# Patient Record
Sex: Female | Born: 1975
Health system: Southern US, Community
[De-identification: ages and names within clinical notes are randomized; demographics above are authoritative.]

## PROBLEM LIST (undated history)

## (undated) DIAGNOSIS — R002 Palpitations: Secondary | ICD-10-CM

## (undated) DIAGNOSIS — J45909 Unspecified asthma, uncomplicated: Secondary | ICD-10-CM

## (undated) DIAGNOSIS — N920 Excessive and frequent menstruation with regular cycle: Secondary | ICD-10-CM

## (undated) DIAGNOSIS — D649 Anemia, unspecified: Secondary | ICD-10-CM

## (undated) DIAGNOSIS — E010 Iodine-deficiency related diffuse (endemic) goiter: Secondary | ICD-10-CM

## (undated) DIAGNOSIS — N915 Oligomenorrhea, unspecified: Secondary | ICD-10-CM

## (undated) DIAGNOSIS — R519 Headache, unspecified: Secondary | ICD-10-CM

## (undated) HISTORY — DX: Anemia, unspecified: D64.9

## (undated) HISTORY — DX: Excessive and frequent menstruation with regular cycle: N92.0

## (undated) HISTORY — DX: Iodine-deficiency related diffuse (endemic) goiter: E01.0

## (undated) HISTORY — DX: Unspecified asthma, uncomplicated: J45.909

## (undated) HISTORY — DX: Palpitations: R00.2

## (undated) HISTORY — PX: BILATERAL CARPAL TUNNEL RELEASE: SHX6508

## (undated) HISTORY — DX: Oligomenorrhea, unspecified: N91.5

---

## 1999-06-25 ENCOUNTER — Other Ambulatory Visit: Admission: RE | Admit: 1999-06-25 | Discharge: 1999-06-25 | Payer: Self-pay

## 1999-08-30 ENCOUNTER — Encounter: Payer: Self-pay | Admitting: Emergency Medicine

## 1999-08-30 ENCOUNTER — Encounter (INDEPENDENT_AMBULATORY_CARE_PROVIDER_SITE_OTHER): Payer: Self-pay | Admitting: Specialist

## 1999-08-30 ENCOUNTER — Inpatient Hospital Stay (HOSPITAL_COMMUNITY): Admission: AD | Admit: 1999-08-30 | Discharge: 1999-09-02 | Payer: Self-pay | Admitting: *Deleted

## 1999-09-14 ENCOUNTER — Other Ambulatory Visit: Admission: RE | Admit: 1999-09-14 | Discharge: 1999-09-14 | Payer: Self-pay

## 1999-09-14 ENCOUNTER — Other Ambulatory Visit: Admission: RE | Admit: 1999-09-14 | Discharge: 1999-09-14 | Payer: Self-pay | Admitting: Obstetrics & Gynecology

## 2000-11-24 ENCOUNTER — Inpatient Hospital Stay (HOSPITAL_COMMUNITY): Admission: AD | Admit: 2000-11-24 | Discharge: 2000-11-24 | Payer: Self-pay | Admitting: Family Medicine

## 2002-09-06 ENCOUNTER — Other Ambulatory Visit: Admission: RE | Admit: 2002-09-06 | Discharge: 2002-09-06 | Payer: Self-pay | Admitting: Obstetrics & Gynecology

## 2002-10-13 ENCOUNTER — Inpatient Hospital Stay (HOSPITAL_COMMUNITY): Admission: AD | Admit: 2002-10-13 | Discharge: 2002-10-13 | Payer: Self-pay | Admitting: *Deleted

## 2002-10-13 ENCOUNTER — Encounter: Payer: Self-pay | Admitting: Obstetrics and Gynecology

## 2002-12-07 ENCOUNTER — Ambulatory Visit (HOSPITAL_COMMUNITY): Admission: RE | Admit: 2002-12-07 | Discharge: 2002-12-07 | Payer: Self-pay | Admitting: Obstetrics and Gynecology

## 2003-01-06 ENCOUNTER — Encounter (HOSPITAL_COMMUNITY): Admission: RE | Admit: 2003-01-06 | Discharge: 2003-01-27 | Payer: Self-pay | Admitting: Obstetrics and Gynecology

## 2003-02-03 ENCOUNTER — Encounter (HOSPITAL_COMMUNITY): Admission: RE | Admit: 2003-02-03 | Discharge: 2003-03-05 | Payer: Self-pay | Admitting: Obstetrics and Gynecology

## 2003-02-22 ENCOUNTER — Ambulatory Visit (HOSPITAL_COMMUNITY): Admission: RE | Admit: 2003-02-22 | Discharge: 2003-02-22 | Payer: Self-pay | Admitting: Obstetrics and Gynecology

## 2003-02-22 ENCOUNTER — Encounter: Payer: Self-pay | Admitting: Obstetrics and Gynecology

## 2003-02-24 ENCOUNTER — Inpatient Hospital Stay (HOSPITAL_COMMUNITY): Admission: AD | Admit: 2003-02-24 | Discharge: 2003-02-24 | Payer: Self-pay | Admitting: Obstetrics and Gynecology

## 2003-03-10 ENCOUNTER — Encounter (HOSPITAL_COMMUNITY): Admission: RE | Admit: 2003-03-10 | Discharge: 2003-04-09 | Payer: Self-pay | Admitting: Obstetrics and Gynecology

## 2003-04-14 ENCOUNTER — Encounter (HOSPITAL_COMMUNITY): Admission: RE | Admit: 2003-04-14 | Discharge: 2003-05-14 | Payer: Self-pay | Admitting: Obstetrics and Gynecology

## 2003-04-25 ENCOUNTER — Inpatient Hospital Stay (HOSPITAL_COMMUNITY): Admission: AD | Admit: 2003-04-25 | Discharge: 2003-04-25 | Payer: Self-pay | Admitting: Obstetrics and Gynecology

## 2003-05-11 ENCOUNTER — Inpatient Hospital Stay (HOSPITAL_COMMUNITY): Admission: AD | Admit: 2003-05-11 | Discharge: 2003-05-11 | Payer: Self-pay | Admitting: Obstetrics and Gynecology

## 2003-05-12 ENCOUNTER — Inpatient Hospital Stay (HOSPITAL_COMMUNITY): Admission: AD | Admit: 2003-05-12 | Discharge: 2003-05-12 | Payer: Self-pay | Admitting: Obstetrics and Gynecology

## 2003-05-20 ENCOUNTER — Inpatient Hospital Stay (HOSPITAL_COMMUNITY): Admission: AD | Admit: 2003-05-20 | Discharge: 2003-05-23 | Payer: Self-pay | Admitting: Obstetrics and Gynecology

## 2004-03-21 ENCOUNTER — Other Ambulatory Visit: Admission: RE | Admit: 2004-03-21 | Discharge: 2004-03-21 | Payer: Self-pay | Admitting: Obstetrics and Gynecology

## 2005-03-27 ENCOUNTER — Other Ambulatory Visit: Admission: RE | Admit: 2005-03-27 | Discharge: 2005-03-27 | Payer: Self-pay | Admitting: Obstetrics and Gynecology

## 2007-08-17 ENCOUNTER — Emergency Department (HOSPITAL_COMMUNITY): Admission: EM | Admit: 2007-08-17 | Discharge: 2007-08-17 | Payer: Self-pay | Admitting: Emergency Medicine

## 2010-09-21 NOTE — Discharge Summary (Signed)
[facility]  Patient:    Patricia Bishop, Patricia Bishop                        MRN: [id number] Adm. Date:  [id number] Disc. Date: [id number] Attending:  Ardeen Fillers CC:         Sung Amabile. Roslyn Smiling, M.D. at office                           Discharge Summary  DISCHARGE DIAGNOSES:          1. Inevitable abortion at [redacted] weeks gestational                                  age.                               2. Chorioamnionitis.                               3. History of chlamydia.  OPERATIVE PROCEDURES:         Vaginal delivery on August 31, 1999.  HISTORY OF PRESENT ILLNESS:   A 35 year old woman, G2 P0-0-1-0, last menstrual period April 24, 1999 admitted at 18+ weeks gestational age by dates after being transferred from [facility] emergency room, where she presented on the evening of August 30, 1999 with complaints of cramping and bleeding. Patient was completely dilated when she was examined there.                                She had no history of cervical surgery.  Her antenatal course was remarkable for chlamydia which was treated earlier in the pregnancy.                                On evaluation, she was febrile to 100.2 and she stated that she had been cramping for several days prior to her admission. Her abdomen was soft.  The fundus was slightly tender and there was blood in the vaginal vault.  Cervix was completely dilated and membranes were bulging. Her white blood count was 8.2.  Urinalysis was negative.  Impression was an inevitable abortion at [redacted] weeks gestational age, which could be consistent with either cervical incompetence and subsequent infection or, originally ascending infection with subsequent labor.  HOSPITAL COURSE:              She was admitted to the AICU at [facility] for delivery.  Unasyn was given.  Patient was also treated with Zithromax, given her recent history of chlamydia.  She was given IV sedation and  several hours later, delivered a stillborn female fetus.  The placenta was extruded spontaneously.  Pathology evaluation of the placenta showed an immature placenta with chromonemata and a three-vessel cord.  The baby was judged to be a female fetus with an estimated fetal age of 73 weeks.  No external phenotypic anomalies were noted.                                Unasyn was  continued through postpartum day #1. She was discharged to home on postpartum day #1, afebrile, and in satisfactory condition.  FOLLOW-UP:                    By Dr. Seymour Bars in two weeks.  Depo-Provera was given prior to discharge.  INSTRUCTIONS:                 She was advised to call should she experience fever, abdominal pain, or very heavy bleeding.  MEDICATIONS AT DISCHARGE:     Prenatal vitamins and nonsteroidal anti-inflammatory medication.                                Patient declined fetal autopsy. DD:  09/29/99 TD:  10/02/99 Job: [id number]

## 2010-09-21 NOTE — H&P (Signed)
NAME:  BRANDYCE, DIMARIO NO.:  [id number]   MEDICAL RECORD NO.:  [id number]                   PATIENT TYPE:  INP   LOCATION:  9172                                 [facility]ILITY:  [facility]   PHYSICIAN:  Osborn Coho, M.D.                DATE OF BIRTH:  13-Jan-1976   DATE OF ADMISSION:  05/20/2003  DATE OF DISCHARGE:                                HISTORY & PHYSICAL   HISTORY OF PRESENT ILLNESS:  Patricia Bishop is a 35 year old, single, black  female, gravida 3, para 0-0-2-0, at 37-4/7 weeks, who presents from the  office for evaluation to maternity admissions secondary to complaint of  uterine contractions every four to five minutes all day and possibly leaking  for the last couple of days.  She denies any large gushes of fluid, but  reports kind of feeling dampness in her underwear.  She also had a nonstress  test at the office today that was nonreactive.  She was having a nonstress  test because of recent onset of hypertension.  She denies any nausea,  vomiting, headaches, or visual disturbances.  Her pregnancy has been  followed at [facility] by the M.D. service and has been at risk  of:  1.  A history of 18-week loss with a question of incompetent cervix for  which she has had a cerclage with this pregnancy that was removed on May 11, 2003.  She also had first trimester spotting, a history of asthma, and  recent onset of pregnancy-induced hypertension, now on methyldopa 250 mg  p.o. t.i.d.  She also is positive for group B Streptococcus.  She would like  to plan on an epidural for labor.   OBSTETRICAL AND GYNECOLOGICAL HISTORY:  She is a gravida 3, para 0-0-2-0.  She had an elective AB in 1995 with no complications.  In April of 2001, she  had an 18-week loss associated with some cramping and bleeding and  subsequent delivery.  She reports that the infant did have fetal heart rate  prior to delivery, but when it was born was stillborn.  She was told  at that  time that she probably had an incompetent cervix and for that reason had a  cerclage placed with this pregnancy.  Other GYN history is significant for  chlamydia treated in 2001.   ALLERGIES:  She has no known drug allergies.   GENERAL MEDICAL HISTORY:  She reports having had the usual childhood  diseases.  She reports a history of asthma, but has had no problems  recently.  History of occasionally urinary tract infection.  Her only  surgery was wisdom tooth removal.   FAMILY HISTORY:  Significant for mother with hypertension on medication,  maternal grandmother with Parkinson's disease, and maternal grandfather with  colon cancer, now deceased.   GENETIC HISTORY:  Negative.   SOCIAL HISTORY:  She is single.  The  father of the baby is involved and  supportive.  His name is Metallurgist.  They deny any illicit drug use, alcohol,  or smoking with this pregnancy.  She is employed as a Associate Professor and he  is also employed at [employer].   PRENATAL LABORATORY DATA:  Her blood type is O positive.  Antibody screen is  negative.  Sickle cell trait is negative.  Syphilis is nonreactive.  Rubella  is positive.  Hepatitis B surface antigen is negative.  HIV is negative.  Cystic fibrosis is negative.  GC and chlamydia are negative.  Pap was within  normal limits in May of 2004.  Her 36-week beta Streptococcus was positive  and gonorrhea and chlamydia cultures were negative.   PHYSICAL EXAMINATION:  VITAL SIGNS:  Her blood pressure today is 142-150  systolic and 92-99 diastolic.  She is afebrile.  HEENT:  Grossly within normal limits.  HEART:  Regular rate and rhythm.  CHEST:  Clear.  BREASTS:  Soft and nontender.  ABDOMEN:  Gravid with uterine contractions every 3-1/2 to 5 minutes.  Her  fetal heart rate is reactive and reassuring currently.  PELVIC:  Her cervix is 4.5 cm, 90%, vertex -1 with intact membranes  palpable.  Negative ferning.  Negative nitrazine.  She did have a   biophysical profile that was 6/8 with 0 for breathing and an AFI of 5.2 cm.  EXTREMITIES:  Within normal limits.   ASSESSMENT:  1. Intrauterine pregnancy at term.  2. Early active labor.  3. Positive group B Streptococcus.  4. Pregnancy-induced hypertension, rule out preeclampsia.   PLAN:  1. Admit to labor and delivery per Osborn Coho, M.D.  2. Collect [facility] laboratories and a clean-catch urine with admission     laboratories.  3. Give her penicillin for group B Streptococcus prophylaxis and methyldopa     250 mg p.o. t.i.d.     Concha Pyo. Duplantis, C.N.M.              Osborn Coho, M.D.    SJD/MEDQ  D:  05/20/2003  T:  05/20/2003  Job:  [id number]

## 2010-09-21 NOTE — Op Note (Signed)
   NAME:  Patricia Bishop, Patricia Bishop NO.:  [id number]   MEDICAL RECORD NO.:  [id number]                   PATIENT TYPE:  AMB   LOCATION:  SDC                                  [facility]:  [facility]   PHYSICIAN:  Osborn Coho, M.D.                DATE OF BIRTH:  08-27-1975   DATE OF PROCEDURE:  12/07/2002  DATE OF DISCHARGE:                                 OPERATIVE REPORT   PREOPERATIVE DIAGNOSES:  1. Incompetent cervix.  2. Thirteen weeks 6 days estimated gestational age.   POSTOPERATIVE DIAGNOSES:  1. Incompetent cervix.  2. Thirteen weeks 6 days estimated gestational age.   PROCEDURE:  Cervical cerclage.   ANESTHESIA:  Spinal.   ATTENDING:  Osborn Coho, M.D.   FLUIDS REPLACED:  1000 mL.   ESTIMATED BLOOD LOSS:  Minimal (less than 10 mL).   URINE OUTPUT:  Approximately 50 mL via straight catheter prior to procedure.   COMPLICATIONS:  None.   FINDINGS:  Vaginal exam:  Cervix is closed and uterus is approximately 13-14  week size uterus.   DESCRIPTION OF PROCEDURE:  The patient was taken to the operating room after  the risks, benefits, and alternatives were discussed with the patient.  The  patient verbalized understanding and consent was signed and witnessed.  The  patient was given a spinal for anesthesia and placed in the dorsal lithotomy  position and prepped and draped in the normal sterile fashion.  A weighted  speculum was placed in the patient's vagina and an anterior vaginal wall  retractor used for retraction.  The cervical cerclage was placed using a  Mersilene suture.  The knot was left at the anterior lip of the cervix.  There was less than approximately 10 mL of bleeding from the cervix during  the procedure.  The cervix was hemostatic at the end of the procedure.  The  count was correct.  The patient tolerated the procedure well except for some  nausea, which required some Phenergan during the procedure.  The patient was  returned to  the recovery room in stable condition.                                               Osborn Coho, M.D.    AR/MEDQ  D:  12/07/2002  T:  12/07/2002  Job:  [id number]

## 2010-09-21 NOTE — H&P (Signed)
NAME:  Patricia Bishop, Patricia Bishop NO.:  [id number]   MEDICAL RECORD NO.:  [id number]                   PATIENT TYPE:  AMB   LOCATION:  SDC                                  [facility]   PHYSICIAN:  Osborn Coho, M.D.                DATE OF BIRTH:  1975-05-26   DATE OF ADMISSION:  12/07/2002  DATE OF DISCHARGE:                                HISTORY & PHYSICAL   HISTORY OF PRESENT ILLNESS:  The patient complains of a history of  incompetent cervix and presented to [facility] at 9 weeks estimated  gestational age.  The patient underwent ultrasound on November 30, 2002 that  showed a cervical length of 2.4 cm.  A cervical cerclage as well as  Delalutin was discussed with the patient and the patient opted for both the  cervical cerclage and is leaning towards getting the Delalutin starting at  16-20 weeks estimated gestational age.  The risks, benefits, and  alternatives of a cervical cerclage were discussed with the patient  including possible rupture of membranes.  The patient wanted to proceed with  a cervical cerclage.  Prenatal care [facility] since  approximately 9 weeks estimated gestational age with blood pressure of  100/60, positive bacterial vaginosis treated early in her pregnancy and  positive urinary tract infection treated early in the pregnancy.   PRENATAL LABORATORIES:  O positive, antibody negative, sickle cell trait  negative, hemoglobin 12.3, hematocrit 36.9, RPR nonreactive, rubella immune,  hepatitis B surface antigen negative, HIV negative, Pap smear normal,  gonorrhea negative, Chlamydia negative, CF screening negative.   PAST OBSTETRICAL HISTORY:  In 1995 elective termination of pregnancy x1 and  in 2001 pregnancy loss at 18 weeks (probable incompetent cervix).   PAST GYNECOLOGIC HISTORY:  History of regular menses, positive history of  Chlamydia treated with test of cure, no history of abnormal Pap smears, past  use  of Depo-Provera stopped 3-4 years ago.   PAST MEDICAL HISTORY:  Asthma, allergy induced (no hospitalizations or  intubations), history of urinary tract infection.   PAST SURGICAL HISTORY:  Wisdom teeth extracted, D&C.   MEDICINES:  Prenatal vitamin, Advair and albuterol p.r.n. (last used April  2004).   ALLERGIES:  No known drug allergies.   SOCIAL HISTORY:  Positive history of cigarette use approximately one to two  cigarettes per day stopped with positive pregnancy test, positive history of  occasional alcohol use stopped with positive pregnancy test, denies history  of illicit drug use.   FAMILY HISTORY:  Hypertension in mother, Parkinson's in maternal  grandmother, colon cancer in maternal grandfather and paternal grandfather.   PHYSICAL EXAMINATION:  VITAL SIGNS:  Temperature 98.9, pulse 82, blood  pressure 122/65, respiratory rate 16, fetal heart rate 150, O2 saturation  100%.  LUNGS:  Clear to auscultation bilaterally.  HEART:  Rate and rhythm are regular.  ABDOMEN:  Soft and  nontender.  EXTREMITIES:  Within normal limits.  PELVIC:  External genitalia within normal limits.  Vaginal exam cervix is  closed.  Positive fetal heart tones.   ASSESSMENT AND PLAN:  Ms. Rebacca Votaw is a 35 year old gravida 3 para 0 at  13 weeks and 6 days with a history of incompetent cervix scheduled for a  cervical cerclage on December 07, 2002.  The risks, benefits, and alternatives  have been discussed with the patient.  The patient verbalized understanding  and consent signed and witnessed.  The patient is also considering Delalutin  to start at approximately 16-20 weeks estimated gestational age.                                               Osborn Coho, M.D.    AR/MEDQ  D:  12/07/2002  T:  12/07/2002  Job:  [id number]

## 2011-01-29 LAB — DIFFERENTIAL
Basophils Absolute: 0
Basophils Relative: 1
Eosinophils Absolute: 0.4
Eosinophils Relative: 7 — ABNORMAL HIGH
Monocytes Absolute: 0.2
Monocytes Relative: 5
Neutro Abs: 2.6

## 2011-01-29 LAB — BASIC METABOLIC PANEL
CO2: 24
Calcium: 9.3
Chloride: 110
GFR calc Af Amer: >60
Glucose, Bld: 104 — ABNORMAL HIGH
Potassium: 3.6
Sodium: 141

## 2011-01-29 LAB — URINALYSIS, ROUTINE W REFLEX MICROSCOPIC
Bilirubin Urine: NEGATIVE
Glucose, UA: NEGATIVE
Hgb urine dipstick: NEGATIVE
Specific Gravity, Urine: 1.003 — ABNORMAL LOW
Urobilinogen, UA: 0.2

## 2011-01-29 LAB — POCT CARDIAC MARKERS
CKMB, poc: 1
Troponin i, poc: <0.05

## 2011-01-29 LAB — CBC
HCT: 40.6
Hemoglobin: 13.9
MCHC: 34.3
MCV: 83.3
RBC: 4.88
RDW: 12.7

## 2012-01-14 ENCOUNTER — Telehealth: Payer: Self-pay | Admitting: Obstetrics and Gynecology

## 2012-01-14 NOTE — Telephone Encounter (Signed)
TRIAGE/GENERAL QUES

## 2012-01-14 NOTE — Telephone Encounter (Signed)
Tc to pt per telephone call. Pt c/o," bldg only after x past 5 IC encounters". No dyspareunia. No vaginal odor recently. Appt sched 02/03/12 with EP for AEX and eval of bldg after IC. Pt voices understanding.

## 2012-02-03 ENCOUNTER — Ambulatory Visit: Payer: Self-pay | Admitting: Obstetrics and Gynecology

## 2012-02-03 ENCOUNTER — Encounter: Payer: Self-pay | Admitting: Obstetrics and Gynecology

## 2012-02-03 ENCOUNTER — Ambulatory Visit (INDEPENDENT_AMBULATORY_CARE_PROVIDER_SITE_OTHER): Payer: Self-pay | Admitting: Obstetrics and Gynecology

## 2012-02-03 VITALS — BP 120/80 | Resp 16 | Ht 64.0 in | Wt 174.0 lb

## 2012-02-03 DIAGNOSIS — N898 Other specified noninflammatory disorders of vagina: Secondary | ICD-10-CM

## 2012-02-03 DIAGNOSIS — N760 Acute vaginitis: Secondary | ICD-10-CM

## 2012-02-03 DIAGNOSIS — A499 Bacterial infection, unspecified: Secondary | ICD-10-CM

## 2012-02-03 DIAGNOSIS — N930 Postcoital and contact bleeding: Secondary | ICD-10-CM

## 2012-02-03 DIAGNOSIS — B9689 Other specified bacterial agents as the cause of diseases classified elsewhere: Secondary | ICD-10-CM

## 2012-02-03 LAB — POCT URINE PREGNANCY: Preg Test, Ur: NEGATIVE

## 2012-02-03 LAB — POCT WET PREP (WET MOUNT): pH: 5.5

## 2012-02-03 LAB — POCT URINALYSIS DIPSTICK
Glucose, UA: NEGATIVE
Leukocytes, UA: NEGATIVE
Nitrite, UA: NEGATIVE
Protein, UA: NEGATIVE
Urobilinogen, UA: NEGATIVE

## 2012-02-03 MED ORDER — METRONIDAZOLE 500 MG PO TABS: 500.0000 mg | ORAL_TABLET | Freq: Two times a day (BID) | ORAL | Status: DC

## 2012-02-03 NOTE — Progress Notes (Signed)
36 YO reports being on HCG for several months with heavy menses requiring the change of an overnight pad every hours, moderate cramping and flow for 5-7 days.  Normally she had a  5 day flow with regular pad change pads 3/ day (before HCG shots). Additionally, she began to experience post coital bleeding, period-like, that would resolve shortly after post-coital shower.  Denies any dyspareunia, vaginiits symptoms (except smells like ammonia), intermenstrual bleeding or pelvic pain.  Patient stopped HCG last month with periods starting to normalized but still has the post coital bleeding.  Has had the same partner for 15 years so declines STD testing.  Denies history of abnormal PAP smear.  O: Pelvic: EGBUS-wnl, vagina-thin grey malodorous discharge, cervix-everted without lesions or contact bleeding, uterus-normal size without tenderness, adnexae-no masses or tenderness  Wet Prep: pH-5.5,  whiff-positive, clue cells-many  UPT-negative.  A: Bacterial Vaginosis     Post-Coital Bleeding  P:  Patient wants to minimize evaluation due to cost.  Reviewed evaluation that is recommended for evaluation of      post-coital bleeding:  PAP smear (pt. reports her last was about 3 years ago because she doesn't have insurance)      Advised of [facility].  Also told patient that GC/CT testing and pelvic ultrasound for     pelvic masses is also a part of evaluating post-coital bleeding.       Metronidazole 500 mg  #14 bid x 7 days no refills;  Etoh precautions        RTO-as scheduled or prn;  advised that if symptoms continued, will need to do more evaluation as outlined above.  Jill Ruppe, PA-C

## 2012-02-03 NOTE — Patient Instructions (Addendum)
Bacterial Vaginosis Bacterial vaginosis (BV) is a vaginal infection where the normal balance of bacteria in the vagina is disrupted. The normal balance is then replaced by an overgrowth of certain bacteria. There are several different kinds of bacteria that can cause BV. BV is the most common vaginal infection in women of childbearing age. CAUSES   The cause of BV is not fully understood. BV develops when there is an increase or imbalance of harmful bacteria.   Some activities or behaviors can upset the normal balance of bacteria in the vagina and put women at increased risk including:   Having a new sex partner or multiple sex partners.   Douching.   Using an intrauterine device (IUD) for contraception.   It is not clear what role sexual activity plays in the development of BV. However, women that have never had sexual intercourse are rarely infected with BV.  Women do not get BV from toilet seats, bedding, swimming pools or from touching objects around them.  SYMPTOMS   Grey vaginal discharge.   A fish-like odor with discharge, especially after sexual intercourse.   Itching or burning of the vagina and vulva.   Burning or pain with urination.   Avoid: - excess soap on genital area (consider using plain oatmeal soap) - use of powder or sprays in genital area - douching - wearing underwear to bed (except with menses) - using more than is directed detergent when washing clothes - tight fitting garments around genital area - excess sugar intake    Some women have no signs or symptoms at all.  DIAGNOSIS  Your caregiver must examine the vagina for signs of BV. Your caregiver will perform lab tests and look at the sample of vaginal fluid through a microscope. They will look for bacteria and abnormal cells (clue cells), a pH test higher than 4.5, and a positive amine test all associated with BV.  RISKS AND COMPLICATIONS   Pelvic inflammatory disease (PID).   Infections following  gynecology surgery.   Developing HIV.   Developing herpes virus.  TREATMENT  Sometimes BV will clear up without treatment. However, all women with symptoms of BV should be treated to avoid complications, especially if gynecology surgery is planned. Female partners generally do not need to be treated. However, BV may spread between female sex partners so treatment is helpful in preventing a recurrence of BV.   BV may be treated with antibiotics. The antibiotics come in either pill or vaginal cream forms. Either can be used with nonpregnant or pregnant women, but the recommended dosages differ. These antibiotics are not harmful to the baby.   BV can recur after treatment. If this happens, a second round of antibiotics will often be prescribed.   Treatment is important for pregnant women. If not treated, BV can cause a premature delivery, especially for a pregnant woman who had a premature birth in the past. All pregnant women who have symptoms of BV should be checked and treated.   For chronic reoccurrence of BV, treatment with a type of prescribed gel vaginally twice a week is helpful.  HOME CARE INSTRUCTIONS   Finish all medication as directed by your caregiver.   Do not have sex until treatment is completed.   Tell your sexual partner that you have a vaginal infection. They should see their caregiver and be treated if they have problems, such as a mild rash or itching.   Practice safe sex. Use condoms. Only have 1 sex partner.  PREVENTION  Basic prevention steps can help reduce the risk of upsetting the natural balance of bacteria in the vagina and developing BV:  Do not have sexual intercourse (be abstinent).   Do not douche.   Use all of the medicine prescribed for treatment of BV, even if the signs and symptoms go away.   Tell your sex partner if you have BV. That way, they can be treated, if needed, to prevent reoccurrence.  SEEK MEDICAL CARE IF:   Your symptoms are not  improving after 3 days of treatment.   You have increased discharge, pain, or fever.  MAKE SURE YOU:   Understand these instructions.   Will watch your condition.   Will get help right away if you are not doing well or get worse.  FOR MORE INFORMATION  Division of STD Prevention (DSTDP), Centers for Disease Control and Prevention: [employer]s.[employer]za American Social Health Association (ASHA): www.ashastd.org  Document Released: 04/22/2005 Document Revised: 04/11/2011 Document Reviewed: 10/13/2008 [facility] Patient Information 2012 [place], [place].   [facility] [phone]   ask about Cervical Cancer Screening

## 2012-02-03 NOTE — Progress Notes (Signed)
Regular Periods: no Mammogram: no  Monthly Breast Ex.: no Exercise: yes  Tetanus < 10 years: yes Seatbelts: yes  NI. Bladder Functn.: yes Abuse at home: no  Daily BM's: yes Stressful Work: no  Healthy Diet: no Sigmoid-Colonoscopy: never  Calcium: no Medical problems this year: c/o bright red bleeding after IC on several different occasions. Recent HCG shots every wk for wt lost caused heavy menses soaking overnight pads had to change every 2 hrs. Discontinued HCG shots 2 months ago and menses are beginning to regulate. C/o vaginal discharge smells like ammonia before and after menses. Pt declined STD check due to lack of insurance & only wants pap done at the providers discretion.    LAST PAP: 08/2007 WNL  Contraception: none  Mammogram:  never  PCP: none  PMH: no changes per pt  FMH: no changes per pt   Last Bone Scan: never

## 2012-02-10 ENCOUNTER — Ambulatory Visit: Payer: Self-pay | Admitting: Obstetrics and Gynecology

## 2012-04-15 ENCOUNTER — Telehealth: Payer: Self-pay | Admitting: Obstetrics and Gynecology

## 2012-04-16 NOTE — Telephone Encounter (Signed)
Tc to pt regarding msg.  Pt states she typically uses Vagisil body wash and had ran out, used her boyfriend's Dial body wash and has been having vaginal itching and irritation for over a week, unsure if it is related.  Pt advised to come in for an eval.  Pt scheduled for an appt on Monday 1445 w/ EP for eval.

## 2012-04-20 ENCOUNTER — Ambulatory Visit: Payer: Self-pay | Admitting: Obstetrics and Gynecology

## 2012-05-20 ENCOUNTER — Other Ambulatory Visit: Payer: Self-pay | Admitting: Obstetrics and Gynecology

## 2012-05-20 NOTE — Telephone Encounter (Signed)
Spoke with pt rgd msg pt wants rx for bv advised pt need appt pt has appt 05/26/12 at 11:30 with ar pt voice understanding

## 2012-05-25 ENCOUNTER — Encounter: Payer: Self-pay | Admitting: Obstetrics and Gynecology

## 2012-05-26 ENCOUNTER — Encounter: Payer: Self-pay | Admitting: Obstetrics and Gynecology

## 2014-03-07 ENCOUNTER — Encounter: Payer: Self-pay | Admitting: Obstetrics and Gynecology

## 2014-08-01 ENCOUNTER — Ambulatory Visit (INDEPENDENT_AMBULATORY_CARE_PROVIDER_SITE_OTHER): Payer: BLUE CROSS/BLUE SHIELD | Admitting: Family Medicine

## 2014-08-01 ENCOUNTER — Encounter (HOSPITAL_BASED_OUTPATIENT_CLINIC_OR_DEPARTMENT_OTHER): Payer: Self-pay | Admitting: *Deleted

## 2014-08-01 ENCOUNTER — Encounter: Payer: Self-pay | Admitting: Family Medicine

## 2014-08-01 ENCOUNTER — Emergency Department (HOSPITAL_BASED_OUTPATIENT_CLINIC_OR_DEPARTMENT_OTHER)
Admission: EM | Admit: 2014-08-01 | Discharge: 2014-08-01 | Disposition: A | Discharge status: Home / Self Care | Payer: BLUE CROSS/BLUE SHIELD | Attending: Emergency Medicine | Admitting: Emergency Medicine

## 2014-08-01 VITALS — BP 121/85 | HR 62 | Ht 64.0 in | Wt 183.0 lb

## 2014-08-01 DIAGNOSIS — Z862 Personal history of diseases of the blood and blood-forming organs and certain disorders involving the immune mechanism: Secondary | ICD-10-CM | POA: Diagnosis not present

## 2014-08-01 DIAGNOSIS — M75101 Unspecified rotator cuff tear or rupture of right shoulder, not specified as traumatic: Secondary | ICD-10-CM | POA: Diagnosis not present

## 2014-08-01 DIAGNOSIS — Z8639 Personal history of other endocrine, nutritional and metabolic disease: Secondary | ICD-10-CM | POA: Diagnosis not present

## 2014-08-01 DIAGNOSIS — J45909 Unspecified asthma, uncomplicated: Secondary | ICD-10-CM | POA: Insufficient documentation

## 2014-08-01 DIAGNOSIS — M25511 Pain in right shoulder: Secondary | ICD-10-CM

## 2014-08-01 DIAGNOSIS — Z79899 Other long term (current) drug therapy: Secondary | ICD-10-CM | POA: Insufficient documentation

## 2014-08-01 DIAGNOSIS — Z8742 Personal history of other diseases of the female genital tract: Secondary | ICD-10-CM | POA: Diagnosis not present

## 2014-08-01 MED ORDER — HYDROCODONE-ACETAMINOPHEN 5-325 MG PO TABS: 1.0000 | ORAL_TABLET | Freq: Four times a day (QID) | ORAL | Status: DC | PRN

## 2014-08-01 MED ORDER — HYDROCODONE-ACETAMINOPHEN 5-325 MG PO TABS
2.0000 | ORAL_TABLET | Freq: Once | ORAL | Status: AC
  Administered 2014-08-01: 2 via ORAL
  Filled 2014-08-01: qty 2

## 2014-08-01 MED ORDER — METHYLPREDNISOLONE ACETATE 40 MG/ML IJ SUSP
40.0000 mg | Freq: Once | INTRAMUSCULAR | Status: AC
  Administered 2014-08-01: 40 mg via INTRA_ARTICULAR

## 2014-08-01 NOTE — ED Notes (Addendum)
Pt. States right shoulder pain for past week, but worse past 4 days.  States unable to lift right/move arm due to pain. Denies any injury. States she is a [employer]. Has taken ibuprofen and hot/cold patches with no relief. States she feels her right shoulder is a swollen. On exam no redness noted to right shoulder, but does have a small amount of swelling noted.

## 2014-08-01 NOTE — ED Notes (Signed)
Ice pack given to patient at d/c home

## 2014-08-01 NOTE — ED Provider Notes (Addendum)
CSN: [id number]     Arrival date & time 08/01/14  0603 History   First MD Initiated Contact with Patient 08/01/14 [phone]     Chief Complaint  Patient presents with  . 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 She is here with a one-week history of pain in her right shoulder. She denies injury. The pain has come on gradually and has acutely worsened over the past 2 days. Her right shoulder is now painful to the point that her range of motion is limited and she is unable to abduct her right upper extremity at the shoulder. There is some associated edema of the right upper arm. She describes the pain is sharp and radiating down to her elbow. There is no neck pain or change in pain with movement of her neck. There is no numbness or weakness in the right upper extremity distally. She has been taking ibuprofen without relief. She has also used topical pain relieving patches without relief.  Past Medical History  Diagnosis Date  . Oligomenorrhea   . Menorrhagia   . Thyromegaly   . Anemia   . Heart palpitations   . Asthma     allergy induced    History reviewed. No pertinent past surgical history. Family History  Problem Relation Age of Onset  . Hypertension Mother   . Cancer Maternal Grandmother   . Cancer Paternal Grandfather    History  Substance Use Topics  . Smoking status: Never Smoker   . Smokeless tobacco: Never Used  . Alcohol Use: No   OB History    Gravida Para Term Preterm AB TAB SAB Ectopic Multiple Living   2 1        1      Review of Systems  All other systems reviewed and are negative.   Allergies  Other  Home Medications   Prior to Admission medications   Medication Sig Start Date End Date Taking? Authorizing Provider  Albuterol Sulfate (PROAIR HFA IN) Inhale into the lungs as needed.    Historical Provider, MD  metroNIDAZOLE (FLAGYL) 500 MG  tablet Take 1 tablet (500 mg total) by mouth 2 (two) times daily with a meal. 02/03/12   Elmira Powell, PA-C   Pulse 83  Temp(Src) 99.1 F (37.3 C) (Oral)  Resp 18  Ht 5\' 4"  (1.626 m)  Wt 182 lb (82.555 kg)  BMI 31.22 kg/m2  SpO2 99%   Physical Exam  General: Well-developed, well-nourished female in no acute distress; appearance consistent with age of record HENT: normocephalic; atraumatic Eyes: pupils equal, round and reactive to light; extraocular muscles intact Neck: supple; full range of motion; nontender Heart: regular rate and rhythm Lungs: clear to auscultation bilaterally Abdomen: soft; nondistended Extremities: No deformity; mild edema of right shoulder and upper arm; tenderness of right rotator cuff with severe pain on attempted range of motion, notably abduction and internal rotation, right upper extremity distally neurovascularly intact Neurologic: Awake, alert and oriented; motor function intact in all extremities and symmetric; no facial droop Skin: Warm and dry Psychiatric: Tearful on exam   ED Course  Procedures (including critical care time)   MDM  Plain films are not indicated at this time as patient has no history of trauma. MRI may be indicated if symptoms persist despite conservative treatment. Will refer to Dr. Barbaraann Barthel of sports medicine.    Shanon Rosser, MD 08/01/14 3546  Shanon Rosser, MD 08/01/14 [phone]

## 2014-08-01 NOTE — Patient Instructions (Signed)
You have rotator cuff impingement Try to avoid painful activities (overhead activities, lifting with extended arm) as much as possible. Aleve 2 tabs twice a day with food OR ibuprofen 3-4 tabs three times a day with food for pain and inflammation. Can take tylenol in addition to this. Subacromial injection may be beneficial to help with pain and to decrease inflammation - you were given this today. Consider physical therapy with transition to home exercise program. Do home exercise program with theraband and scapular stabilization exercises daily - these are very important for long term relief even if an injection was given. 3 sets of 10 once a day - wait about 5-7 days before starting this. If not improving at follow-up we will consider further imaging, physical therapy and/or nitro patches. Follow up with me in 5-6 weeks but call me if not improving over next 1-2 weeks with the shot.

## 2014-08-04 DIAGNOSIS — M25511 Pain in right shoulder: Secondary | ICD-10-CM | POA: Insufficient documentation

## 2014-08-04 NOTE — Progress Notes (Signed)
PCP: Pcp Not In System  Subjective:   HPI: Patient is a 39 y.o. female here for right shoulder pain..  Patient reports she's had shoulder pain for about a week. No known injury. Pain started in shoulder, went down to elbow. + night pain. Seems to improve overall as day goes on though. Tried ibuprofen, goody powders, icy hot, heating pad. Taking hydrocodone for pain also. Pain worse with reaching, overhead motions.  Past Medical History  Diagnosis Date  . Oligomenorrhea   . Menorrhagia   . Thyromegaly   . Anemia   . Heart palpitations   . Asthma     allergy induced     Current Outpatient Prescriptions on File Prior to Visit  Medication Sig Dispense Refill  . HYDROcodone-acetaminophen (NORCO/VICODIN) 5-325 MG per tablet Take 1-2 tablets by mouth every 6 (six) hours as needed. 20 tablet 0  . mometasone-formoterol (DULERA) 100-5 MCG/ACT AERO Inhale 2 puffs into the lungs 2 (two) times daily.    . Albuterol Sulfate (PROAIR HFA IN) Inhale into the lungs as needed.    . metroNIDAZOLE (FLAGYL) 500 MG tablet Take 1 tablet (500 mg total) by mouth 2 (two) times daily with a meal. 14 tablet 0   No current facility-administered medications on file prior to visit.    No past surgical history on file.  Allergies  Allergen Reactions  . Other     Seasonal     History   Social History  . Marital Status: Married    Spouse Name: N/A  . Number of Children: N/A  . Years of Education: N/A   Occupational History  . Not on file.   Social History Main Topics  . Smoking status: Never Smoker   . Smokeless tobacco: Never Used  . Alcohol Use: No  . Drug Use: No  . Sexual Activity:    Partners: Male    Birth Control/ Protection: None   Other Topics Concern  . Not on file   Social History Narrative    Family History  Problem Relation Age of Onset  . Hypertension Mother   . Cancer Maternal Grandmother   . Cancer Paternal Grandfather     BP 121/85 mmHg  Pulse 62  Ht 5'  4" (1.626 m)  Wt 183 lb (83.008 kg)  BMI 31.40 kg/m2  LMP 06/17/2014  Review of Systems: See HPI above.    Objective:  Physical Exam:  Gen: NAD  Right shoulder: No swelling, ecchymoses.  No gross deformity. No TTP. FROM with painful arc. Positive Hawkins, Neers. Negative Speeds, Yergasons. Strength 5/5 with empty can and resisted internal/external rotation.  Pain empty can. Negative apprehension. NV intact distally.    Assessment & Plan:  1. Right shoulder pain - 2/2 rotator cuff impingement.  Injection given today.  Shown home exercises to do daily.  Consider imaging, PT, nitro patches if not improving.  F/u in 5-6 weeks.  After informed written consent, patient was seated on exam table. Right shoulder was prepped with alcohol swab and utilizing posterior approach, patient's right subacromial space was injected with 3:1 marcaine: depomedrol. Patient tolerated the procedure well without immediate complications.

## 2014-08-04 NOTE — Assessment & Plan Note (Signed)
2/2 rotator cuff impingement.  Injection given today.  Shown home exercises to do daily.  Consider imaging, PT, nitro patches if not improving.  F/u in 5-6 weeks.  After informed written consent, patient was seated on exam table. Right shoulder was prepped with alcohol swab and utilizing posterior approach, patient's right subacromial space was injected with 3:1 marcaine: depomedrol. Patient tolerated the procedure well without immediate complications.

## 2014-09-12 ENCOUNTER — Emergency Department (HOSPITAL_BASED_OUTPATIENT_CLINIC_OR_DEPARTMENT_OTHER): Payer: BLUE CROSS/BLUE SHIELD

## 2014-09-12 ENCOUNTER — Emergency Department (HOSPITAL_BASED_OUTPATIENT_CLINIC_OR_DEPARTMENT_OTHER)
Admission: EM | Admit: 2014-09-12 | Discharge: 2014-09-12 | Disposition: A | Discharge status: Home / Self Care | Payer: BLUE CROSS/BLUE SHIELD | Attending: Emergency Medicine | Admitting: Emergency Medicine

## 2014-09-12 ENCOUNTER — Encounter (HOSPITAL_BASED_OUTPATIENT_CLINIC_OR_DEPARTMENT_OTHER): Payer: Self-pay

## 2014-09-12 DIAGNOSIS — Y92410 Unspecified street and highway as the place of occurrence of the external cause: Secondary | ICD-10-CM | POA: Diagnosis not present

## 2014-09-12 DIAGNOSIS — S39012A Strain of muscle, fascia and tendon of lower back, initial encounter: Secondary | ICD-10-CM | POA: Diagnosis not present

## 2014-09-12 DIAGNOSIS — Z7951 Long term (current) use of inhaled steroids: Secondary | ICD-10-CM | POA: Diagnosis not present

## 2014-09-12 DIAGNOSIS — Z792 Long term (current) use of antibiotics: Secondary | ICD-10-CM | POA: Insufficient documentation

## 2014-09-12 DIAGNOSIS — Y9389 Activity, other specified: Secondary | ICD-10-CM | POA: Insufficient documentation

## 2014-09-12 DIAGNOSIS — Z8742 Personal history of other diseases of the female genital tract: Secondary | ICD-10-CM | POA: Insufficient documentation

## 2014-09-12 DIAGNOSIS — Z79899 Other long term (current) drug therapy: Secondary | ICD-10-CM | POA: Insufficient documentation

## 2014-09-12 DIAGNOSIS — J45909 Unspecified asthma, uncomplicated: Secondary | ICD-10-CM | POA: Diagnosis not present

## 2014-09-12 DIAGNOSIS — Z3202 Encounter for pregnancy test, result negative: Secondary | ICD-10-CM | POA: Diagnosis not present

## 2014-09-12 DIAGNOSIS — Y998 Other external cause status: Secondary | ICD-10-CM | POA: Diagnosis not present

## 2014-09-12 DIAGNOSIS — Z862 Personal history of diseases of the blood and blood-forming organs and certain disorders involving the immune mechanism: Secondary | ICD-10-CM | POA: Insufficient documentation

## 2014-09-12 DIAGNOSIS — S3992XA Unspecified injury of lower back, initial encounter: Secondary | ICD-10-CM | POA: Diagnosis present

## 2014-09-12 LAB — PREGNANCY, URINE: Preg Test, Ur: NEGATIVE

## 2014-09-12 MED ORDER — CYCLOBENZAPRINE HCL 5 MG PO TABS: 5.0000 mg | ORAL_TABLET | Freq: Three times a day (TID) | ORAL | Status: DC | PRN

## 2014-09-12 MED ORDER — IBUPROFEN 800 MG PO TABS: 800.0000 mg | ORAL_TABLET | Freq: Three times a day (TID) | ORAL | Status: DC
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
## 2014-09-12 NOTE — Discharge Instructions (Signed)
Return to the ED with any concerns including weakness of legs, not able to urinate, chest pain, difficulty breathing, abdominal pain or vomiting, decreased level of alertness/lethargy, or any other alarming symptoms
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
## 2015-01-31 ENCOUNTER — Encounter (INDEPENDENT_AMBULATORY_CARE_PROVIDER_SITE_OTHER): Payer: Self-pay

## 2015-01-31 ENCOUNTER — Ambulatory Visit (INDEPENDENT_AMBULATORY_CARE_PROVIDER_SITE_OTHER): Payer: BLUE CROSS/BLUE SHIELD | Admitting: Family Medicine

## 2015-01-31 ENCOUNTER — Encounter: Payer: Self-pay | Admitting: Family Medicine

## 2015-01-31 VITALS — BP 128/85 | HR 71 | Ht 63.0 in | Wt 188.4 lb

## 2015-01-31 DIAGNOSIS — M79645 Pain in left finger(s): Secondary | ICD-10-CM

## 2015-01-31 DIAGNOSIS — M6530 Trigger finger, unspecified finger: Secondary | ICD-10-CM

## 2015-01-31 MED ORDER — METHYLPREDNISOLONE ACETATE 40 MG/ML IJ SUSP
40.0000 mg | Freq: Once | INTRAMUSCULAR | Status: AC
  Administered 2015-01-31: 20 mg via INTRA_ARTICULAR

## 2015-01-31 NOTE — Patient Instructions (Signed)
You have a trigger finger (of your thumb). Wear the splint as often as you can for next month. Ibuprofen or aleve if needed. You were given a cortisone shot today. Follow up with me in 1 month.

## 2015-02-01 DIAGNOSIS — M6530 Trigger finger, unspecified finger: Secondary | ICD-10-CM | POA: Insufficient documentation

## 2015-02-01 NOTE — Assessment & Plan Note (Signed)
Left trigger thumb - discussed options and she chose to go ahead with injection which was given today.  Splint as often as possible, nsaids.  F/u in 1 month.  After informed written consent patient was seated in chair of exam room.  Area over A1 pulley left 1st digit prepped with alcohol swab then injected with 0.5:0.58mL marcaine: depomedrol.  Patient tolerated procedure well without immediate complications.

## 2015-02-01 NOTE — Progress Notes (Signed)
PCP: Chevis Pretty, MD  Subjective:   HPI: Patient is a 39 y.o. female here for left thumb pain.  Patient reports about 1 month ago she started to get pain in left thumb. Trouble bending this fully and will pop. Pain 1/10 at rest, up to 9/10 when flexing it. + swelling. No known injury or trauma. Right handed.  Past Medical History  Diagnosis Date  . Oligomenorrhea   . Menorrhagia   . Thyromegaly   . Anemia   . Heart palpitations   . Asthma     allergy induced     Current Outpatient Prescriptions on File Prior to Visit  Medication Sig Dispense Refill  . Albuterol Sulfate (PROAIR HFA IN) Inhale into the lungs as needed.    . cyclobenzaprine (FLEXERIL) 5 MG tablet Take 1 tablet (5 mg total) by mouth 3 (three) times daily as needed for muscle spasms. 20 tablet 0  . HYDROcodone-acetaminophen (NORCO/VICODIN) 5-325 MG per tablet Take 1-2 tablets by mouth every 6 (six) hours as needed. 20 tablet 0  . ibuprofen (ADVIL,MOTRIN) 800 MG tablet Take 1 tablet (800 mg total) by mouth 3 (three) times daily. 21 tablet 0  . metroNIDAZOLE (FLAGYL) 500 MG tablet Take 1 tablet (500 mg total) by mouth 2 (two) times daily with a meal. 14 tablet 0  . mometasone-formoterol (DULERA) 100-5 MCG/ACT AERO Inhale 2 puffs into the lungs 2 (two) times daily.    Marland Kitchen PROAIR HFA 108 (90 BASE) MCG/ACT inhaler   6   No current facility-administered medications on file prior to visit.    No past surgical history on file.  Allergies  Allergen Reactions  . Other     Seasonal     Social History   Social History  . Marital Status: Married    Spouse Name: N/A  . Number of Children: N/A  . Years of Education: N/A   Occupational History  . Not on file.   Social History Main Topics  . Smoking status: Never Smoker   . Smokeless tobacco: Never Used  . Alcohol Use: No  . Drug Use: No  . Sexual Activity:    Partners: Male    Birth Control/ Protection: None   Other Topics Concern  . Not on file    Social History Narrative    Family History  Problem Relation Age of Onset  . Hypertension Mother   . Cancer Maternal Grandmother   . Cancer Paternal Grandfather     BP 128/85 mmHg  Pulse 71  Ht 5\' 3"  (1.6 m)  Wt 188 lb 6.4 oz (85.458 kg)  BMI 33.38 kg/m2  Review of Systems: See HPI above.    Objective:  Physical Exam:  Gen: NAD  Left hand: Nodule felt A1 pulley area of 1st digit.  No swelling, bruising, other deformity. TTP A1 pulley of 1st digit.  No other tenderness. FROM with pain on flexion of IP joint 1st digit. Collateral ligaments intact. NVI distally Negative finkelsteins, tinels.    Assessment & Plan:  1. Left trigger thumb - discussed options and she chose to go ahead with injection which was given today.  Splint as often as possible, nsaids.  F/u in 1 month.  After informed written consent patient was seated in chair of exam room.  Area over A1 pulley left 1st digit prepped with alcohol swab then injected with 0.5:0.43mL marcaine: depomedrol.  Patient tolerated procedure well without immediate complications.

## 2015-07-07 ENCOUNTER — Ambulatory Visit: Payer: BLUE CROSS/BLUE SHIELD | Admitting: Family Medicine

## 2015-07-07 ENCOUNTER — Ambulatory Visit (INDEPENDENT_AMBULATORY_CARE_PROVIDER_SITE_OTHER): Payer: BLUE CROSS/BLUE SHIELD | Admitting: Family Medicine

## 2015-07-07 ENCOUNTER — Encounter: Payer: Self-pay | Admitting: Family Medicine

## 2015-07-07 VITALS — BP 153/89 | HR 68 | Ht 63.0 in | Wt 188.0 lb

## 2015-07-07 DIAGNOSIS — G5601 Carpal tunnel syndrome, right upper limb: Secondary | ICD-10-CM

## 2015-07-07 MED ORDER — PREDNISONE 10 MG PO TABS: ORAL_TABLET | ORAL | Status: DC

## 2015-07-07 NOTE — Patient Instructions (Signed)
You have carpal tunnel syndrome. Wear the wrist brace at nighttime and as often as possible during the day Ibuprofen 600mg  three times a day with food OR aleve 2 tabs twice a day with food for pain and inflammation. A prednisone dose pack is a consideration. Corticosteroid injection is a consideration to help with pain and inflammation. If you choose to do the prednisone dose pack first but don't get enough relief, I would plan to see you first thing on Friday to do the injection before you leave for the beach.

## 2015-07-11 DIAGNOSIS — J45909 Unspecified asthma, uncomplicated: Secondary | ICD-10-CM | POA: Insufficient documentation

## 2015-07-11 DIAGNOSIS — G5601 Carpal tunnel syndrome, right upper limb: Secondary | ICD-10-CM | POA: Insufficient documentation

## 2015-07-11 DIAGNOSIS — J309 Allergic rhinitis, unspecified: Secondary | ICD-10-CM | POA: Insufficient documentation

## 2015-07-11 DIAGNOSIS — G43909 Migraine, unspecified, not intractable, without status migrainosus: Secondary | ICD-10-CM | POA: Insufficient documentation

## 2015-07-11 NOTE — Assessment & Plan Note (Signed)
start wrist brace at nighttime and as often as possible during the day.  She would like to start prednisone dose pack - if not improving will consider injection.  F/u in 1 month or prn otherwise.

## 2015-07-11 NOTE — Progress Notes (Signed)
PCP: Chevis Pretty, MD  Subjective:   HPI: Patient is a 40 y.o. female here for right hand pain.  Patient denies known injury. She states 3 days ago she started to get pain in thumb area. Progressed to include pain in other digits and numbness, tingling in her fingertips. Pain level is 0/10 at rest, up to 7/10 with movement of wrist, sharp. 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  Past Medical History  Diagnosis Date  . Oligomenorrhea   . Menorrhagia   . Thyromegaly   . Anemia   . Heart palpitations   . Asthma     allergy induced     No current outpatient prescriptions on file prior to visit.   No current facility-administered medications on file prior to visit.    No past surgical history on file.  Allergies  Allergen Reactions  . Other Anaphylaxis    Seasonal     Social History   Social History  . Marital Status: Married    Spouse Name: N/A  . Number of Children: N/A  . Years of Education: N/A   Occupational History  . Not on file.   Social History Main Topics  . Smoking status: Never Smoker   . Smokeless tobacco: Never Used  . Alcohol Use: No  . Drug Use: No  . Sexual Activity:    Partners: Male    Birth Control/ Protection: None   Other Topics Concern  . Not on file   Social History Narrative    Family History  Problem Relation Age of Onset  . Hypertension Mother   . Cancer Maternal Grandmother   . Cancer Paternal Grandfather     BP 153/89 mmHg  Pulse 68  Ht 5\' 3"  (1.6 m)  Wt 188 lb (85.276 kg)  BMI 33.31 kg/m2  Review of Systems: See HPI above.    Objective:  Physical Exam:  Gen: NAD, comfortable in exam room  Right hand/wrist: No gross deformity, swelling, bruising. TTP carpal tunnel.  No other tenderness. FROM wrist and digits with 5/5 strength. Positive tinels and phalens. Sensation intact currently to light touch.    Assessment & Plan:  1. Right carpal tunnel syndrome - start  wrist brace at nighttime and as often as possible during the day.  She would like to start prednisone dose pack - if not improving will consider injection.  F/u in 1 month or prn otherwise.

## 2015-12-27 ENCOUNTER — Telehealth: Payer: Self-pay | Admitting: Family Medicine

## 2015-12-27 NOTE — Telephone Encounter (Signed)
It's been almost 6 months since we've seen her.  If she's still having problems we need to see her back.

## 2015-12-28 MED ORDER — TRAMADOL HCL 50 MG PO TABS: 50.0000 mg | ORAL_TABLET | Freq: Four times a day (QID) | ORAL | 0 refills | Status: DC | PRN

## 2015-12-28 MED ORDER — CYCLOBENZAPRINE HCL 10 MG PO TABS: 10.0000 mg | ORAL_TABLET | Freq: Three times a day (TID) | ORAL | 0 refills | Status: DC | PRN

## 2015-12-28 NOTE — Telephone Encounter (Signed)
Sounds good - I sent the medicine there.

## 2015-12-28 NOTE — Telephone Encounter (Signed)
Giving small course of tramadol, flexeril to get her to the appointment.  She will have to pick up the tramadol though.

## 2016-01-01 ENCOUNTER — Encounter (INDEPENDENT_AMBULATORY_CARE_PROVIDER_SITE_OTHER): Payer: Self-pay

## 2016-01-01 ENCOUNTER — Ambulatory Visit (INDEPENDENT_AMBULATORY_CARE_PROVIDER_SITE_OTHER): Payer: BLUE CROSS/BLUE SHIELD | Admitting: Family Medicine

## 2016-01-01 ENCOUNTER — Encounter: Payer: Self-pay | Admitting: Family Medicine

## 2016-01-01 VITALS — BP 120/85 | HR 80 | Ht 64.0 in | Wt 189.0 lb

## 2016-01-01 DIAGNOSIS — M25521 Pain in right elbow: Secondary | ICD-10-CM | POA: Diagnosis not present

## 2016-01-01 DIAGNOSIS — G5601 Carpal tunnel syndrome, right upper limb: Secondary | ICD-10-CM | POA: Diagnosis not present

## 2016-01-01 DIAGNOSIS — M25512 Pain in left shoulder: Secondary | ICD-10-CM | POA: Diagnosis not present

## 2016-01-01 MED ORDER — PREDNISONE 10 MG PO TABS: ORAL_TABLET | ORAL | 0 refills | Status: DC

## 2016-01-01 MED ORDER — METHYLPREDNISOLONE ACETATE 40 MG/ML IJ SUSP
40.0000 mg | Freq: Once | INTRAMUSCULAR | Status: AC
  Administered 2016-01-01: 40 mg via INTRA_ARTICULAR

## 2016-01-01 NOTE — Patient Instructions (Signed)
You have carpal tunnel syndrome. Wear the wrist brace at nighttime and as often as possible during the day Take prednisone as directed for 12 days. Corticosteroid injection is a consideration to help with pain and inflammation. Would do nerve conduction studies as next step if still not improving.  You have rotator cuff impingement Try to avoid painful activities (overhead activities, lifting with extended arm) as much as possible. Prednisone dose pack as noted above. Can take tylenol in addition to this. Subacromial injection may be beneficial to help with pain and to decrease inflammation - you were given this today. Consider physical therapy with transition to home exercise program. Do home exercise program with theraband and scapular stabilization exercises daily - these are very important for long term relief even if an injection was given.  3 sets of 10 once a day. If not improving at follow-up we will consider further imaging, physical therapy, and/or nitro patches.  You have lateral epicondylitis (tennis elbow) Try to avoid painful activities as much as possible. Ice the area 3-4 times a day for 15 minutes at a time. Prednsone as noted above Counterforce brace as directed can help unload area - wear this regularly if it provides you with relief. Hammer rotation exercise, wrist extension exercise with 1 pound weight - 3 sets of 10 once a day.   Consider physical therapy, nitro patches if not improving. Follow up with me in 1 month for these issues.

## 2016-01-03 DIAGNOSIS — M25512 Pain in left shoulder: Secondary | ICD-10-CM | POA: Insufficient documentation

## 2016-01-03 DIAGNOSIS — M25521 Pain in right elbow: Secondary | ICD-10-CM | POA: Insufficient documentation

## 2016-01-03 NOTE — Assessment & Plan Note (Signed)
2/2 traumatic lateral epicondylitis.  Shown home exercises to do daily.  Icing, prednisone, sleeve.  Consider physical therapy, nitro patches if not improving.

## 2016-01-03 NOTE — Assessment & Plan Note (Signed)
Continue wrist brace.  Start extended prednisone.  Consider NCVs if not improving.  F/u in 1 month.

## 2016-01-03 NOTE — Assessment & Plan Note (Signed)
2/2 rotator cuff impingement.  Injection given today.  Shown home exercises to do daily.  Prednisone.  Consider nitro, imaging, PT if not improving.  After informed written consent, patient was seated on exam table. Left shoulder was prepped with alcohol swab and utilizing posterior approach, patient's left subacromial space was injected with 3:1 marcaine: depomedrol. Patient tolerated the procedure well without immediate complications.

## 2016-01-03 NOTE — Progress Notes (Signed)
PCP: Chevis Pretty, MD  Subjective:   HPI: Patient is a 40 y.o. female here for right hand, elbow, left shoulder pain.  07/07/15: Patient denies known injury. She states 3 days ago she started to get pain in thumb area. Progressed to include pain in other digits and numbness, tingling in her fingertips. Pain level is 0/10 at rest, up to 7/10 with movement of wrist, sharp. 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  01/01/16: Patient reports she continues to have pain in right thumb area with radiation of numbness into all fingers. Pain level 4/10, sharp. 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 Uses wrist brace at nighttime. Also with worsening pain left lateral shoulder. 6/10 level, sharp with overhead motions and reaching. Feels similar to problem she had right shoulder. Taking tramadol, flexeril. Also had right elbow go through a chair a month ago (plastic) and has lateral right elbow pain 4/10 level. No skin changes, numbness of elbow.  Past Medical History:  Diagnosis Date  . Anemia   . Asthma    allergy induced   . Heart palpitations   . Menorrhagia   . Oligomenorrhea   . Thyromegaly     Current Outpatient Prescriptions on File Prior to Visit  Medication Sig Dispense Refill  . albuterol (PROAIR HFA) 108 (90 Base) MCG/ACT inhaler Inhale into the lungs.    . cyclobenzaprine (FLEXERIL) 10 MG tablet Take 1 tablet (10 mg total) by mouth 3 (three) times daily as needed for muscle spasms. 30 tablet 0  . Fluticasone-Salmeterol (ADVAIR DISKUS) 100-50 MCG/DOSE AEPB Inhale into the lungs.     No current facility-administered medications on file prior to visit.     No past surgical history on file.  Allergies  Allergen Reactions  . Other Anaphylaxis    Seasonal     Social History   Social History  . Marital status: Married    Spouse name: N/A  . Number of children: N/A  . Years of education: N/A   Occupational History   . Not on file.   Social History Main Topics  . Smoking status: Never Smoker  . Smokeless tobacco: Never Used  . Alcohol use No  . Drug use: No  . Sexual activity: Yes    Partners: Male    Birth control/ protection: None   Other Topics Concern  . Not on file   Social History Narrative  . No narrative on file    Family History  Problem Relation Age of Onset  . Hypertension Mother   . Cancer Maternal Grandmother   . Cancer Paternal Grandfather     BP 120/85   Pulse 80   Ht 5\' 4"  (1.626 m)   Wt 189 lb (85.7 kg)   BMI 32.44 kg/m   Review of Systems: See HPI above.    Objective:  Physical Exam:  Gen: NAD, comfortable in exam room  Right hand/wrist: No gross deformity, swelling, bruising. TTP carpal tunnel.  No other tenderness. FROM wrist and digits with 5/5 strength. Negative tinels and phalens. Sensation diminished to light touch throughout.  Right elbow: No gross deformity, swelling, bruising. TTP lateral epicondyle and just distal to this. FROM elbow and wrist. Pain on resisted wrist and 3rd digit extension. Collateral ligaments intact.  Left shoulder: No swelling, ecchymoses.  No gross deformity. No TTP. FROM with painful arc. Positive Hawkins, Neers. Negative Speeds, Yergasons. Strength 5/5 with empty can and resisted internal/external rotation.  Pain empty can > ER.  Negative apprehension. NV intact distally.    Assessment & Plan:  1. Right carpal tunnel syndrome - Continue wrist brace.  Start extended prednisone.  Consider NCVs if not improving.  F/u in 1 month.  2. Left shoulder pain - 2/2 rotator cuff impingement.  Injection given today.  Shown home exercises to do daily.  Prednisone.  Consider nitro, imaging, PT if not improving.  After informed written consent, patient was seated on exam table. Left shoulder was prepped with alcohol swab and utilizing posterior approach, patient's left subacromial space was injected with 3:1 marcaine:  depomedrol. Patient tolerated the procedure well without immediate complications.  3. Right elbow pain - 2/2 traumatic lateral epicondylitis.  Shown home exercises to do daily.  Icing, prednisone, sleeve.  Consider physical therapy, nitro patches if not improving.

## 2016-02-05 ENCOUNTER — Ambulatory Visit (INDEPENDENT_AMBULATORY_CARE_PROVIDER_SITE_OTHER): Payer: BLUE CROSS/BLUE SHIELD | Admitting: Family Medicine

## 2016-02-05 ENCOUNTER — Encounter: Payer: Self-pay | Admitting: Family Medicine

## 2016-02-05 DIAGNOSIS — G5601 Carpal tunnel syndrome, right upper limb: Secondary | ICD-10-CM

## 2016-02-05 DIAGNOSIS — M25521 Pain in right elbow: Secondary | ICD-10-CM | POA: Diagnosis not present

## 2016-02-05 DIAGNOSIS — M25512 Pain in left shoulder: Secondary | ICD-10-CM

## 2016-02-05 MED ORDER — DICLOFENAC SODIUM 75 MG PO TBEC: 75.0000 mg | DELAYED_RELEASE_TABLET | Freq: Two times a day (BID) | ORAL | 1 refills | Status: DC

## 2016-02-05 MED ORDER — NITROGLYCERIN 0.2 MG/HR TD PT24: MEDICATED_PATCH | TRANSDERMAL | 1 refills | Status: DC

## 2016-02-05 NOTE — Patient Instructions (Signed)
Try going without the wrist braces - if you notice the numbness in the morning then go back to using them a few more weeks just when you're sleeping.  You have lateral epicondylitis (tennis elbow) Try to avoid painful activities as much as possible. Ice the area 3-4 times a day for 15 minutes at a time. Consider a larger sleeve, counterforce brace. Start physical therapy and do home exercises on days you don't go to therapy. Nitro patches 1/4th patch over affected elbow, change daily. Follow up with me in 6 weeks.

## 2016-02-06 ENCOUNTER — Telehealth: Payer: Self-pay | Admitting: Family Medicine

## 2016-02-06 NOTE — Progress Notes (Signed)
PCP: Chevis Pretty, MD  Subjective:   HPI: Patient is a 40 y.o. female here for right hand, elbow, left shoulder pain.  07/07/15: Patient denies known injury. She states 3 days ago she started to get pain in thumb area. Progressed to include pain in other digits and numbness, tingling in her fingertips. Pain level is 0/10 at rest, up to 7/10 with movement of wrist, sharp. 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  01/01/16: Patient reports she continues to have pain in right thumb area with radiation of numbness into all fingers. Pain level 4/10, sharp. 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 Uses wrist brace at nighttime. Also with worsening pain left lateral shoulder. 6/10 level, sharp with overhead motions and reaching. Feels similar to problem she had right shoulder. Taking tramadol, flexeril. Also had right elbow go through a chair a month ago (plastic) and has lateral right elbow pain 4/10 level. No skin changes, numbness of elbow.  10/2: Patient reports her right wrist and left shoulder have improved. Still having problems with her elbow though. Pain 4/10, sharp, lateral. Worse with work by end of day. Some swelling. No numbness (has improved in wrist with braces). No skin changes.  Past Medical History:  Diagnosis Date  . Anemia   . Asthma    allergy induced   . Heart palpitations   . Menorrhagia   . Oligomenorrhea   . Thyromegaly     Current Outpatient Prescriptions on File Prior to Visit  Medication Sig Dispense Refill  . albuterol (PROAIR HFA) 108 (90 Base) MCG/ACT inhaler Inhale into the lungs.    . Fluticasone-Salmeterol (ADVAIR DISKUS) 100-50 MCG/DOSE AEPB Inhale into the lungs.    . mometasone-formoterol (DULERA) 100-5 MCG/ACT AERO Inhale into the lungs.    . topiramate (TOPAMAX) 25 MG tablet      No current facility-administered medications on file prior to visit.     No past surgical history on  file.  Allergies  Allergen Reactions  . Other Anaphylaxis    Seasonal     Social History   Social History  . Marital status: Married    Spouse name: N/A  . Number of children: N/A  . Years of education: N/A   Occupational History  . Not on file.   Social History Main Topics  . Smoking status: Never Smoker  . Smokeless tobacco: Never Used  . Alcohol use No  . Drug use: No  . Sexual activity: Yes    Partners: Male    Birth control/ protection: None   Other Topics Concern  . Not on file   Social History Narrative  . No narrative on file    Family History  Problem Relation Age of Onset  . Hypertension Mother   . Cancer Maternal Grandmother   . Cancer Paternal Grandfather     BP 126/87   Pulse 79   Ht 5\' 4"  (1.626 m)   Wt 197 lb (89.4 kg)   BMI 33.81 kg/m   Review of Systems: See HPI above.    Objective:  Physical Exam:  Gen: NAD, comfortable in exam room  Right elbow: No gross deformity, swelling, bruising. TTP lateral epicondyle and just distal to this. FROM elbow and wrist. Pain on resisted wrist and 3rd digit extension. Collateral ligaments intact.  Left elbow: FROM without pain.    Assessment & Plan:  1. Right carpal tunnel syndrome - Improved.  Can discontinue wrist brace.    2. Left  shoulder pain - 2/2 rotator cuff impingement. Improved following injection. Home exercises.    3. Right elbow pain - 2/2 traumatic lateral epicondylitis.  Still having problems.  She will start physical therapy.  Sleeve a little too tight - switched out for larger size.  Icing, start nitro patches (discussed risks of headache, skin irritation).  F/u in 6 weeks.

## 2016-02-06 NOTE — Assessment & Plan Note (Signed)
2/2 rotator cuff impingement. Improved following injection. Home exercises.

## 2016-02-06 NOTE — Assessment & Plan Note (Signed)
2/2 traumatic lateral epicondylitis.  Still having problems.  She will start physical therapy.  Sleeve a little too tight - switched out for larger size.  Icing, start nitro patches (discussed risks of headache, skin irritation).  F/u in 6 weeks.

## 2016-02-06 NOTE — Telephone Encounter (Signed)
Spoke to patient and she would like to see if we could find another PT location that is open on weekends. Will see if one is available near her.

## 2016-02-06 NOTE — Assessment & Plan Note (Signed)
Improved.  Can discontinue wrist brace.

## 2016-03-18 ENCOUNTER — Ambulatory Visit: Payer: BLUE CROSS/BLUE SHIELD | Admitting: Family Medicine

## 2016-03-25 ENCOUNTER — Ambulatory Visit: Payer: BLUE CROSS/BLUE SHIELD | Admitting: Family Medicine

## 2016-04-01 ENCOUNTER — Ambulatory Visit (INDEPENDENT_AMBULATORY_CARE_PROVIDER_SITE_OTHER): Payer: BLUE CROSS/BLUE SHIELD | Admitting: Family Medicine

## 2016-04-01 ENCOUNTER — Encounter: Payer: Self-pay | Admitting: Family Medicine

## 2016-04-01 DIAGNOSIS — M25521 Pain in right elbow: Secondary | ICD-10-CM

## 2016-04-01 NOTE — Patient Instructions (Signed)
Continue with the brace, nitro patches. Do home exercises most days of the week for next 6 weeks. Follow up with me in 6 weeks or as needed.

## 2016-04-02 NOTE — Progress Notes (Signed)
PCP: Chevis Pretty, MD  Subjective:   HPI: Patient is a 40 y.o. female here for right hand, elbow, left shoulder pain.  07/07/15: Patient denies known injury. She states 3 days ago she started to get pain in thumb area. Progressed to include pain in other digits and numbness, tingling in her fingertips. Pain level is 0/10 at rest, up to 7/10 with movement of wrist, sharp. 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  01/01/16: Patient reports she continues to have pain in right thumb area with radiation of numbness into all fingers. Pain level 4/10, sharp. 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 Uses wrist brace at nighttime. Also with worsening pain left lateral shoulder. 6/10 level, sharp with overhead motions and reaching. Feels similar to problem she had right shoulder. Taking tramadol, flexeril. Also had right elbow go through a chair a month ago (plastic) and has lateral right elbow pain 4/10 level. No skin changes, numbness of elbow.  10/2: Patient reports her right wrist and left shoulder have improved. Still having problems with her elbow though. Pain 4/10, sharp, lateral. Worse with work by end of day. Some swelling. No numbness (has improved in wrist with braces). No skin changes.  11/27: Patient reports some improvement from last visit. Pain is down to 1/10 level, lateral, more dull. Has been off for thanksgiving break though. Wearing brace, using nitro patches. Did 2 visits of PT and then home exercise program (was too costly to keep going to PT). No skin changes, numbness.  Past Medical History:  Diagnosis Date  . Anemia   . Asthma    allergy induced   . Heart palpitations   . Menorrhagia   . Oligomenorrhea   . Thyromegaly     Current Outpatient Prescriptions on File Prior to Visit  Medication Sig Dispense Refill  . albuterol (PROAIR HFA) 108 (90 Base) MCG/ACT inhaler Inhale into the lungs.    . diclofenac  (VOLTAREN) 75 MG EC tablet Take 1 tablet (75 mg total) by mouth 2 (two) times daily. 60 tablet 1  . Fluticasone-Salmeterol (ADVAIR DISKUS) 100-50 MCG/DOSE AEPB Inhale into the lungs.    . mometasone-formoterol (DULERA) 100-5 MCG/ACT AERO Inhale into the lungs.    . nitroGLYCERIN (NITRODUR - DOSED IN MG/24 HR) 0.2 mg/hr patch Apply 1/4th patch to affected elbow, change daily 30 patch 1  . topiramate (TOPAMAX) 25 MG tablet      No current facility-administered medications on file prior to visit.     No past surgical history on file.  Allergies  Allergen Reactions  . Other Anaphylaxis    Seasonal     Social History   Social History  . Marital status: Married    Spouse name: N/A  . Number of children: N/A  . Years of education: N/A   Occupational History  . Not on file.   Social History Main Topics  . Smoking status: Never Smoker  . Smokeless tobacco: Never Used  . Alcohol use No  . Drug use: No  . Sexual activity: Yes    Partners: Male    Birth control/ protection: None   Other Topics Concern  . Not on file   Social History Narrative  . No narrative on file    Family History  Problem Relation Age of Onset  . Hypertension Mother   . Cancer Maternal Grandmother   . Cancer Paternal Grandfather     BP 121/87   Pulse 93   Ht 5'  4" (1.626 m)   Wt 195 lb (88.5 kg)   BMI 33.47 kg/m   Review of Systems: See HPI above.    Objective:  Physical Exam:  Gen: NAD, comfortable in exam room  Right elbow: No gross deformity, swelling, bruising. No TTP lateral epicondyle currently. FROM elbow and wrist. Mild pain on resisted 3rd digit extension.  No pain other motions of wrist and elbow. Collateral ligaments intact.  Left elbow: FROM without pain.    Assessment & Plan:  1. Right elbow pain - 2/2 traumatic lateral epicondylitis.  Still having problems.  She will start physical therapy.  Sleeve a little too tight - switched out for larger size.  Icing, start nitro  patches (discussed risks of headache, skin irritation).  F/u in 6 weeks.  2. Right carpal tunnel syndrome - Improved.  3. Left shoulder pain - 2/2 rotator cuff impingement. Improved following injection and home exercises.

## 2016-04-02 NOTE — Assessment & Plan Note (Signed)
2/2 traumatic lateral epicondylitis.  Still having problems.  She will start physical therapy.  Sleeve a little too tight - switched out for larger size.  Icing, start nitro patches (discussed risks of headache, skin irritation).  F/u in 6 weeks.

## 2016-05-17 ENCOUNTER — Ambulatory Visit (HOSPITAL_COMMUNITY)
Admission: EM | Admit: 2016-05-17 | Discharge: 2016-05-17 | Disposition: A | Discharge status: Home / Self Care | Payer: BLUE CROSS/BLUE SHIELD | Attending: Family Medicine | Admitting: Family Medicine

## 2016-05-17 ENCOUNTER — Encounter (HOSPITAL_COMMUNITY): Payer: Self-pay | Admitting: Emergency Medicine

## 2016-05-17 DIAGNOSIS — J069 Acute upper respiratory infection, unspecified: Secondary | ICD-10-CM

## 2016-05-17 NOTE — ED Provider Notes (Signed)
CSN: [id number]     Arrival date & time 05/17/16  1852 History   First MD Initiated Contact with Patient 05/17/16 1949     Chief Complaint  Patient presents with  . Cough   (Consider location/radiation/quality/duration/timing/severity/associated sxs/prior Treatment) 41 year old female complaining of a 3 day history of cough, headache, sneezing, runny nose and PND. She is taking TheraFlu with modest relief. Denies fever or achiness.      Past Medical History:  Diagnosis Date  . Anemia   . Asthma    allergy induced   . Heart palpitations   . Menorrhagia   . Oligomenorrhea   . Thyromegaly    History reviewed. No pertinent surgical history. Family History  Problem Relation Age of Onset  . Hypertension Mother   . Cancer Maternal Grandmother   . Cancer Paternal Grandfather    Social History  Substance Use Topics  . Smoking status: Never Smoker  . Smokeless tobacco: Never Used  . Alcohol use No   OB History    Gravida Para Term Preterm AB Living   2 1       1    SAB TAB Ectopic Multiple Live Births                 Review of Systems  Constitutional: Positive for activity change. Negative for appetite change, chills, fatigue and fever.  HENT: Positive for congestion, postnasal drip and rhinorrhea. Negative for facial swelling.   Eyes: Negative.   Respiratory: Negative.   Cardiovascular: Negative.   Musculoskeletal: Negative for neck pain and neck stiffness.  Skin: Negative for pallor and rash.  Neurological: Negative.   All other systems reviewed and are negative.   Allergies  Other  Home Medications   Prior to Admission medications   Medication Sig Start Date End Date Taking? Authorizing Provider  albuterol (PROAIR HFA) 108 (90 Base) MCG/ACT inhaler Inhale into the lungs. 01/16/15  Yes Historical Provider, MD  mometasone-formoterol (DULERA) 100-5 MCG/ACT AERO Inhale into the lungs. 12/19/15  Yes Historical Provider, MD   Meds Ordered and Administered this Visit   Medications - No data to display  BP 150/99 (BP Location: Left Arm)   Pulse 89   Temp 98.7 F (37.1 C) (Oral)   Resp 18   LMP 04/23/2016 (Approximate)   SpO2 100%  No data found.   Physical Exam  Constitutional: She is oriented to person, place, and time. She appears well-developed and well-nourished. No distress.  HENT:  Mouth/Throat: No oropharyngeal exudate.  Bilateral TMs are normal. Oropharynx with cobblestoning, minor erythema and clear PND.  Neck: Normal range of motion. Neck supple.  Cardiovascular: Normal rate, regular rhythm and normal heart sounds.   Pulmonary/Chest: Effort normal and breath sounds normal. No respiratory distress. She has no wheezes.  Musculoskeletal: Normal range of motion. She exhibits no edema.  Lymphadenopathy:    She has no cervical adenopathy.  Neurological: She is alert and oriented to person, place, and time.  Skin: Skin is warm and dry. No rash noted.  Psychiatric: She has a normal mood and affect.  Nursing note and vitals reviewed.   Urgent Care Course   Clinical Course     Procedures (including critical care time)  Labs Review Labs Reviewed - No data to display  Imaging Review No results found.   Visual Acuity Review  Right Eye Distance:   Left Eye Distance:   Bilateral Distance:    Right Eye Near:   Left Eye Near:    Bilateral Near:  MDM   1. Acute upper respiratory infection    If you have coughing spasms or your cough increases do not forget to use your albuterol inhaler. The following medications can be taken for specific symptoms. Sudafed PE 10 mg every 4 to 6 hours as needed for congestion Allegra or Zyrtec daily as needed for drainage and runny nose. For stronger antihistamine may take Chlor-Trimeton 2 to 4 mg every 4 to 6 hours, may cause drowsiness. Saline nasal spray used frequently. Ibuprofen 600 mg every 6 hours as needed for pain, discomfort or fever. Drink plenty of fluids and stay  well-hydrated.     Janne Napoleon, NP 05/17/16 2001

## 2016-05-17 NOTE — Discharge Instructions (Signed)
If you have coughing spasms or your cough increases do not forget to use your albuterol inhaler. The following medications can be taken for specific symptoms. Sudafed PE 10 mg every 4 to 6 hours as needed for congestion Allegra or Zyrtec daily as needed for drainage and runny nose. For stronger antihistamine may take Chlor-Trimeton 2 to 4 mg every 4 to 6 hours, may cause drowsiness. Saline nasal spray used frequently. Ibuprofen 600 mg every 6 hours as needed for pain, discomfort or fever. Drink plenty of fluids and stay well-hydrated.

## 2016-05-17 NOTE — ED Triage Notes (Signed)
The patient presented to the [facility] with a complaint of a cough, congestion, headache and nasal congestion x 4 days. The patient reported using OTC meds.

## 2017-06-01 ENCOUNTER — Other Ambulatory Visit: Payer: Self-pay

## 2017-06-01 ENCOUNTER — Ambulatory Visit (HOSPITAL_COMMUNITY)
Admission: EM | Admit: 2017-06-01 | Discharge: 2017-06-01 | Disposition: A | Discharge status: Home / Self Care | Payer: BLUE CROSS/BLUE SHIELD | Attending: Family Medicine | Admitting: Family Medicine

## 2017-06-01 ENCOUNTER — Encounter (HOSPITAL_COMMUNITY): Payer: Self-pay | Admitting: *Deleted

## 2017-06-01 DIAGNOSIS — J22 Unspecified acute lower respiratory infection: Secondary | ICD-10-CM | POA: Diagnosis not present

## 2017-06-01 MED ORDER — AZITHROMYCIN 250 MG PO TABS: ORAL_TABLET | ORAL | 0 refills | Status: DC

## 2017-06-01 MED ORDER — PREDNISONE 20 MG PO TABS: 40.0000 mg | ORAL_TABLET | Freq: Every day | ORAL | 0 refills | Status: AC

## 2017-06-01 NOTE — ED Triage Notes (Addendum)
C/O starting with nasal congestion and facial pressure 12 days ago; felt she was getting better, but now having HA, productive cough with chest discomfort and some SOB.  Unsure if fevers.  Has been taking Mucinex-D.

## 2017-06-01 NOTE — ED Provider Notes (Signed)
Patricia Bishop    CSN: [id number] Arrival date & time: 06/01/17  [place]     History   Chief Complaint Chief Complaint  Patient presents with  . Cough    HPI Patricia Bishop is a 42 y.o. female.   Patricia Bishop presents with complaints of productive cough, shortness of breath with activity, headache. Symptoms originally started 1/16 with sinus pressure, congestion and runny nose. This has improved but chest congestion has developed over the past few days. No known fevers. No known ill contacts. Without ear or throat pain. History of asthma, uses daily dulera and has been using albuterol as needed, has used twice today which has helped. Did not get a flu vaccine this season. Does not smoke. Without gi/gu complaints. Has been taking mucinex dm which has not helped.    ROS per HPI.       Past Medical History:  Diagnosis Date  . Anemia   . Asthma    allergy induced   . Heart palpitations   . Menorrhagia   . Oligomenorrhea   . Thyromegaly     Patient Active Problem List   Diagnosis Date Noted  . Left shoulder pain 01/03/2016  . Right elbow pain 01/03/2016  . Allergic rhinitis 07/11/2015  . Airway hyperreactivity 07/11/2015  . Headache, migraine 07/11/2015  . Carpal tunnel syndrome, right 07/11/2015  . Trigger finger, acquired 02/01/2015  . Right shoulder pain 08/04/2014    History reviewed. No pertinent surgical history.  OB History    Gravida Para Term Preterm AB Living   2 1       1    SAB TAB Ectopic Multiple Live Births                   Home Medications    Prior to Admission medications   Medication Sig Start Date End Date Taking? Authorizing Provider  albuterol (PROAIR HFA) 108 (90 Base) MCG/ACT inhaler Inhale into the lungs. 01/16/15  Yes [provider]  mometasone-formoterol (DULERA) 100-5 MCG/ACT AERO Inhale into the lungs. 12/19/15  Yes [provider]  azithromycin (ZITHROMAX) 250 MG tablet Take first 2 tablets together, then 1  every day until finished. 06/01/17   Zigmund Gottron, NP  predniSONE (DELTASONE) 20 MG tablet Take 2 tablets (40 mg total) by mouth daily with breakfast for 5 days. 06/01/17 06/06/17  Zigmund Gottron, NP    Family History Family History  Problem Relation Age of Onset  . Hypertension Mother   . Cancer Maternal Grandmother   . Cancer Paternal Grandfather     Social History Social History   Tobacco Use  . Smoking status: Never Smoker  . Smokeless tobacco: Never Used  Substance Use Topics  . Alcohol use: No    Alcohol/week: 0.0 oz  . Drug use: No     Allergies   Other   Review of Systems Review of Systems   Physical Exam Triage Vital Signs ED Triage Vitals  Enc Vitals Group     BP 06/01/17 1614 (!) 161/101     Pulse Rate 06/01/17 1614 74     Resp 06/01/17 1614 20     Temp 06/01/17 1614 98.5 F (36.9 C)     Temp Source 06/01/17 1614 Oral     SpO2 06/01/17 1614 100 %     Weight --      Height --      Head Circumference --      Peak Flow --  Pain Score 06/01/17 1616 5     Pain Loc --      Pain Edu? --      Excl. in [place]? --    No data found.  Updated Vital Signs BP (!) 161/101   Pulse 74   Temp 98.5 F (36.9 C) (Oral)   Resp 20   LMP 05/22/2017 (Approximate)   SpO2 100%   Visual Acuity Right Eye Distance:   Left Eye Distance:   Bilateral Distance:    Right Eye Near:   Left Eye Near:    Bilateral Near:     Physical Exam  Constitutional: She is oriented to person, place, and time. She appears well-developed and well-nourished. No distress.  HENT:  Head: Normocephalic and atraumatic.  Right Ear: Tympanic membrane, external ear and ear canal normal.  Left Ear: Tympanic membrane, external ear and ear canal normal.  Nose: Nose normal.  Mouth/Throat: Uvula is midline, oropharynx is clear and moist and mucous membranes are normal. No tonsillar exudate.  Eyes: Conjunctivae and EOM are normal. Pupils are equal, round, and reactive to light.    Cardiovascular: Normal rate, regular rhythm and normal heart sounds.  Pulmonary/Chest: Effort normal and breath sounds normal. No respiratory distress. She has no wheezes.  Strong congested cough noted  Lymphadenopathy:    She has no cervical adenopathy.  Neurological: She is alert and oriented to person, place, and time.  Skin: Skin is warm and dry.     UC Treatments / Results  Labs (all labs ordered are listed, but only abnormal results are displayed) Labs Reviewed - No data to display  EKG  EKG Interpretation None       Radiology No results found.  Procedures Procedures (including critical care time)  Medications Ordered in UC Medications - No data to display   Initial Impression / Assessment and Plan / UC Course  I have reviewed the triage vital signs and the nursing notes.  Pertinent labs & imaging results that were available during my care of the patient were reviewed by me and considered in my medical decision making (see chart for details).     Symptoms for >10 days, asthma history. Shortness of breath. Opted to cover with azithromycin at this time, 5 days of prednisone. Imaging deferred. Discussed additional supportive cares. Return precautions provided. If symptoms worsen or do not improve in the next week to return to be seen or to follow up with PCP.  Patient verbalized understanding and agreeable to plan.    Final Clinical Impressions(s) / UC Diagnoses   Final diagnoses:  Lower respiratory tract infection    ED Discharge Orders        Ordered    azithromycin (ZITHROMAX) 250 MG tablet     06/01/17 1635    predniSONE (DELTASONE) 20 MG tablet  Daily with breakfast     06/01/17 1635       Controlled Substance Prescriptions [place] Controlled Substance Registry consulted? Not Applicable   Zigmund Gottron, NP 06/01/17 1651

## 2017-06-01 NOTE — Discharge Instructions (Signed)
Push fluids to ensure adequate hydration and keep secretions thin.  Tylenol and/or ibuprofen as needed for pain or fevers.  Complete course of steroids and antibiotics. Inhaler as needed.  If symptoms worsen or do not improve in the next week to return to be seen or to follow up with PCP.

## 2018-01-15 ENCOUNTER — Other Ambulatory Visit: Payer: Self-pay

## 2018-01-15 ENCOUNTER — Emergency Department
Admission: EM | Admit: 2018-01-15 | Discharge: 2018-01-15 | Disposition: A | Discharge status: Home / Self Care | Payer: BLUE CROSS/BLUE SHIELD | Attending: Emergency Medicine | Admitting: Emergency Medicine

## 2018-01-15 DIAGNOSIS — Y999 Unspecified external cause status: Secondary | ICD-10-CM | POA: Diagnosis not present

## 2018-01-15 DIAGNOSIS — Y9389 Activity, other specified: Secondary | ICD-10-CM | POA: Diagnosis not present

## 2018-01-15 DIAGNOSIS — J45909 Unspecified asthma, uncomplicated: Secondary | ICD-10-CM | POA: Diagnosis not present

## 2018-01-15 DIAGNOSIS — S4991XA Unspecified injury of right shoulder and upper arm, initial encounter: Secondary | ICD-10-CM | POA: Diagnosis present

## 2018-01-15 DIAGNOSIS — S46812A Strain of other muscles, fascia and tendons at shoulder and upper arm level, left arm, initial encounter: Secondary | ICD-10-CM | POA: Diagnosis not present

## 2018-01-15 DIAGNOSIS — Y92410 Unspecified street and highway as the place of occurrence of the external cause: Secondary | ICD-10-CM | POA: Insufficient documentation

## 2018-01-15 DIAGNOSIS — S39012A Strain of muscle, fascia and tendon of lower back, initial encounter: Secondary | ICD-10-CM | POA: Insufficient documentation

## 2018-01-15 MED ORDER — METHOCARBAMOL 500 MG PO TABS: 500.0000 mg | ORAL_TABLET | Freq: Four times a day (QID) | ORAL | 0 refills | Status: DC

## 2018-01-15 MED ORDER — MELOXICAM 15 MG PO TABS: 15.0000 mg | ORAL_TABLET | Freq: Every day | ORAL | 0 refills | Status: DC
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
## 2018-03-14 ENCOUNTER — Emergency Department: Payer: BLUE CROSS/BLUE SHIELD

## 2018-03-14 ENCOUNTER — Emergency Department
Admission: EM | Admit: 2018-03-14 | Discharge: 2018-03-14 | Disposition: A | Discharge status: Home / Self Care | Payer: BLUE CROSS/BLUE SHIELD | Attending: Emergency Medicine | Admitting: Emergency Medicine

## 2018-03-14 ENCOUNTER — Encounter: Payer: Self-pay | Admitting: Medical Oncology

## 2018-03-14 DIAGNOSIS — G5603 Carpal tunnel syndrome, bilateral upper limbs: Secondary | ICD-10-CM | POA: Diagnosis not present

## 2018-03-14 DIAGNOSIS — Z79899 Other long term (current) drug therapy: Secondary | ICD-10-CM | POA: Diagnosis not present

## 2018-03-14 DIAGNOSIS — M7989 Other specified soft tissue disorders: Secondary | ICD-10-CM

## 2018-03-14 DIAGNOSIS — R202 Paresthesia of skin: Secondary | ICD-10-CM | POA: Diagnosis present

## 2018-03-14 DIAGNOSIS — J45909 Unspecified asthma, uncomplicated: Secondary | ICD-10-CM | POA: Diagnosis not present

## 2018-03-14 DIAGNOSIS — R2231 Localized swelling, mass and lump, right upper limb: Secondary | ICD-10-CM | POA: Insufficient documentation

## 2018-03-14 MED ORDER — IBUPROFEN 600 MG PO TABS: 600.0000 mg | ORAL_TABLET | Freq: Four times a day (QID) | ORAL | 0 refills | Status: DC | PRN

## 2018-03-14 MED ORDER — PREDNISONE 10 MG PO TABS: ORAL_TABLET | ORAL | 0 refills | Status: DC

## 2018-03-14 MED ORDER — METHYLPREDNISOLONE SODIUM SUCC 125 MG IJ SOLR
125.0000 mg | Freq: Once | INTRAMUSCULAR | Status: AC
  Administered 2018-03-14: 125 mg via INTRAMUSCULAR
  Filled 2018-03-14: qty 2

## 2018-03-14 MED ORDER — KETOROLAC TROMETHAMINE 30 MG/ML IJ SOLN
30.0000 mg | Freq: Once | INTRAMUSCULAR | Status: AC
  Administered 2018-03-14: 30 mg via INTRAMUSCULAR
  Filled 2018-03-14: qty 1

## 2018-03-14 NOTE — ED Triage Notes (Signed)
Pt reports hx of carpel tunnel syndrome, reports rt hand is numb and swollen, pain worsened yesterday.

## 2018-03-14 NOTE — ED Provider Notes (Signed)
[facility] Emergency Department Provider Note  ____________________________________________  Time seen: Approximately 10:48 AM  I have reviewed the triage vital signs and the nursing notes.   HISTORY  Chief Complaint Wrist Pain    HPI Patricia Bishop is a 42 y.o. female that presents to the emergency department for evaluation of right hand numbness and swelling for 1 day.  She is having pain over her wrist at the base of her thumb into the bottom of her wrist.  Patient has a history of carpal tunnel and states that she has been dealing with this for about 4 to 5 years.  Her hands are frequently numb. Her left hand is also currently numb. She usually does not have any swelling with her carpal tunnel so she came to the ER.   She wears a splint to both of her wrists frequently.  She has had cortisone injections in her hands in the past.  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  She is right-hand dominant. No IV drug use.  No recent travel.  She does not smoke.  She is not on any form of hormonal birth control.  No history of a clot.  She has not had any trauma to her hand or any wounds.  No history of gout.  No fever, chills, neck pain.   Past Medical History:  Diagnosis Date  . Anemia   . Asthma    allergy induced   . Heart palpitations   . Menorrhagia   . Oligomenorrhea   . Thyromegaly     Patient Active Problem List   Diagnosis Date Noted  . Left shoulder pain 01/03/2016  . Right elbow pain 01/03/2016  . Allergic rhinitis 07/11/2015  . Airway hyperreactivity 07/11/2015  . Headache, migraine 07/11/2015  . Carpal tunnel syndrome, right 07/11/2015  . Trigger finger, acquired 02/01/2015  . Right shoulder pain 08/04/2014    History reviewed. No pertinent surgical history.  Prior to Admission medications   Medication Sig Start Date End Date Taking? Authorizing Provider  albuterol (PROAIR HFA) 108 (90 Base) MCG/ACT inhaler Inhale into the lungs.  01/16/15   [provider]  azithromycin (ZITHROMAX) 250 MG tablet Take first 2 tablets together, then 1 every day until finished. 06/01/17   Zigmund Gottron, NP  ibuprofen (ADVIL,MOTRIN) 600 MG tablet Take 1 tablet (600 mg total) by mouth every 6 (six) hours as needed. 03/14/18   Laban Emperor, PA-C  meloxicam (MOBIC) 15 MG tablet Take 1 tablet (15 mg total) by mouth daily. 01/15/18   Cuthriell, Charline Bills, PA-C  methocarbamol (ROBAXIN) 500 MG tablet Take 1 tablet (500 mg total) by mouth 4 (four) times daily. 01/15/18   Cuthriell, Charline Bills, PA-C  mometasone-formoterol (DULERA) 100-5 MCG/ACT AERO Inhale into the lungs. 12/19/15   [provider]  predniSONE (DELTASONE) 10 MG tablet Take 6 tablets on day 1, take 5 tablets on day 2, take 4 tablets on day 3, take 3 tablets on day 4, take 2 tablets on day 5, take 1 tablet on day 6 03/14/18   Laban Emperor, PA-C    Allergies Other  Family History  Problem Relation Age of Onset  . Hypertension Mother   . Cancer Maternal Grandmother   . Cancer Paternal Grandfather     Social History Social History   Tobacco Use  . Smoking status: Never Smoker  . Smokeless tobacco: Never Used  Substance Use Topics  . Alcohol use: No  Alcohol/week: 0.0 standard drinks  . Drug use: No     Review of Systems  Constitutional: No fever/chills Cardiovascular: No chest pain. Respiratory: No SOB. Gastrointestinal: No nausea, no vomiting.  Musculoskeletal: Positive for hand pain. Skin: Negative for rash, abrasions, lacerations, ecchymosis. Neurological: Negative for headaches. Positive for numbness.   ____________________________________________   PHYSICAL EXAM:  VITAL SIGNS: ED Triage Vitals  Enc Vitals Group     BP 03/14/18 1039 (!) 145/89     Pulse Rate 03/14/18 1039 92     Resp 03/14/18 1039 18     Temp 03/14/18 1039 98.1 F (36.7 C)     Temp Source 03/14/18 1039 Oral     SpO2 03/14/18 1039 100 %     Weight 03/14/18 1034  196 lb 3.4 oz (89 kg)     Height 03/14/18 1040 5' 3.5" (1.613 m)     Head Circumference --      Peak Flow --      Pain Score 03/14/18 1034 8     Pain Loc --      Pain Edu? --      Excl. in [place]? --      Constitutional: Alert and oriented. Well appearing and in no acute distress. Eyes: Conjunctivae are normal. PERRL. EOMI. Head: Atraumatic. ENT:      Ears:      Nose: No congestion/rhinnorhea.      Mouth/Throat: Mucous membranes are moist.  Neck: No stridor.  No cervical spine tenderness to palpation. Cardiovascular: Normal rate, regular rhythm.  Good peripheral circulation. Symmetrical radial pulses bilaterally. Respiratory: Normal respiratory effort without tachypnea or retractions. Lungs CTAB. Good air entry to the bases with no decreased or absent breath sounds. Gastrointestinal: Bowel sounds 4 quadrants. Soft and nontender to palpation. No guarding or rigidity. No palpable masses. No distention.  Musculoskeletal: Full range of motion to all extremities. No gross deformities appreciated.  Mild diffuse swelling to right hand.  No erythema.  Tenderness to palpation over volar wrist at the base of her thumb.  Positive Phalen's. Neurologic:  Normal speech and language. No gross focal neurologic deficits are appreciated.  Skin:  Skin is warm, dry and intact. No rash noted. Psychiatric: Mood and affect are normal. Speech and behavior are normal. Patient exhibits appropriate insight and judgement.   ____________________________________________   LABS (all labs ordered are listed, but only abnormal results are displayed)  Labs Reviewed - No data to display ____________________________________________  EKG   ____________________________________________  RADIOLOGY Robinette Haines, personally viewed and evaluated these images (plain radiographs) as part of my medical decision making, as well as reviewing the written report by the radiologist.  Dg Cervical Spine 2-3 Views  Result  Date: 03/14/2018 CLINICAL DATA:  Right arm pain numbness and tingling for 2 days. EXAM: CERVICAL SPINE - 2-3 VIEW COMPARISON:  None. FINDINGS: No fracture, bone lesion or spondylolisthesis. The disc spaces are well maintained. There small anterior endplate osteophytes from the lower endplates of C4, C5 and C6. No other degenerative change. Normal soft tissues. IMPRESSION: 1. No fracture or spondylolisthesis. 2. Small anterior endplate osteophytes, C4, C5 and C6. No other degenerative change or abnormality. Electronically Signed   By: Lajean Manes M.D.   On: 03/14/2018 11:32   US Venous Img Upper Uni Right  Result Date: 03/14/2018 CLINICAL DATA:  Right arm swelling for 2 days. EXAM: RIGHT UPPER EXTREMITY VENOUS DOPPLER ULTRASOUND TECHNIQUE: Gray-scale sonography with graded compression, as well as color Doppler and duplex ultrasound were performed  to evaluate the upper extremity deep venous system from the level of the subclavian vein and including the jugular, axillary, basilic, radial, ulnar and upper cephalic vein. Spectral Doppler was utilized to evaluate flow at rest and with distal augmentation maneuvers. COMPARISON:  None. FINDINGS: Contralateral Subclavian Vein: No evidence of thrombus. Normal color Doppler flow and respiratory phasicity. Internal Jugular Vein: No evidence of thrombus. Normal compressibility, respiratory phasicity and color Doppler flow. Subclavian Vein: No evidence of thrombus. Normal color Doppler flow, respiratory phasicity and response to augmentation. Axillary Vein: No evidence of thrombus. Normal compressibility, respiratory phasicity and response to augmentation. Cephalic Vein: No evidence of thrombus. Normal compressibility, respiratory phasicity and response to augmentation. Basilic Vein: No evidence of thrombus. Normal compressibility, respiratory phasicity and response to augmentation. Brachial Veins: No evidence of thrombus. Normal compressibility, respiratory phasicity and  response to augmentation. Radial Veins: No evidence of thrombus. Normal compressibility and color Doppler flow. Ulnar Veins: No evidence of thrombus. Normal compressibility and color Doppler flow. Venous Reflux:  None visualized. Other Findings:  None visualized. IMPRESSION: No evidence of DVT within the right upper extremity. Electronically Signed   By: Markus Daft M.D.   On: 03/14/2018 12:02    ____________________________________________    PROCEDURES  Procedure(s) performed:    Procedures    Medications  methylPREDNISolone sodium succinate (SOLU-MEDROL) 125 mg/2 mL injection 125 mg (125 mg Intramuscular Given 03/14/18 1226)  ketorolac (TORADOL) 30 MG/ML injection 30 mg (30 mg Intramuscular Given 03/14/18 1227)     ____________________________________________   INITIAL IMPRESSION / ASSESSMENT AND PLAN / ED COURSE  Pertinent labs & imaging results that were available during my care of the patient were reviewed by me and considered in my medical decision making (see chart for details).  Review of the [place] CSRS was performed in accordance of the [place] prior to dispensing any controlled drugs.     Patient's diagnosis is consistent with carpal tunnel.  Vital signs and exam are reassuring.  Ultrasound negative for DVT.  Cervical x-ray consistent with minimal osteoarthritis.  IM Solu-Medrol and Toradol were given.  No signs of bacterial infection.  Low suspicion for gout.  Patient will be discharged home with prescriptions for prednisone and ibuprofen. Patient is to follow up with Ortho as directed. Patient is given ED precautions to return to the ED for any worsening or new symptoms.     ____________________________________________  FINAL CLINICAL IMPRESSION(S) / ED DIAGNOSES  Final diagnoses:  Arm swelling  Bilateral carpal tunnel syndrome      NEW MEDICATIONS STARTED DURING THIS VISIT:  ED Discharge Orders         Ordered    ibuprofen (ADVIL,MOTRIN) 600 MG tablet  Every  6 hours PRN     03/14/18 1218    predniSONE (DELTASONE) 10 MG tablet     03/14/18 1218              This chart was dictated using voice recognition software/Dragon. Despite best efforts to proofread, errors can occur which can change the meaning. Any change was purely unintentional.    Laban Emperor, PA-C 03/14/18 1652    Harvest Dark, MD 03/15/18 1245

## 2018-03-14 NOTE — ED Notes (Signed)
Pt to ed with c/o right wrist pain and swelling x 2 days.  Pt reports intermittent bilat numbness in hands for several years.  Pt denies injury.

## 2018-05-03 ENCOUNTER — Other Ambulatory Visit: Payer: Self-pay

## 2018-05-03 ENCOUNTER — Emergency Department: Payer: BLUE CROSS/BLUE SHIELD

## 2018-05-03 ENCOUNTER — Emergency Department
Admission: EM | Admit: 2018-05-03 | Discharge: 2018-05-03 | Disposition: A | Discharge status: Home / Self Care | Payer: BLUE CROSS/BLUE SHIELD | Attending: Emergency Medicine | Admitting: Emergency Medicine

## 2018-05-03 DIAGNOSIS — D649 Anemia, unspecified: Secondary | ICD-10-CM | POA: Diagnosis not present

## 2018-05-03 DIAGNOSIS — Z79899 Other long term (current) drug therapy: Secondary | ICD-10-CM | POA: Diagnosis not present

## 2018-05-03 DIAGNOSIS — J45909 Unspecified asthma, uncomplicated: Secondary | ICD-10-CM | POA: Diagnosis not present

## 2018-05-03 DIAGNOSIS — R079 Chest pain, unspecified: Secondary | ICD-10-CM | POA: Diagnosis present

## 2018-05-03 LAB — BASIC METABOLIC PANEL
Anion gap: 7 (ref 5–15)
BUN: 11 mg/dL (ref 6–20)
CO2: 27 mmol/L (ref 22–32)
Calcium: 8.6 mg/dL — ABNORMAL LOW (ref 8.9–10.3)
Chloride: 104 mmol/L (ref 98–111)
Creatinine, Ser: 0.66 mg/dL (ref 0.44–1.00)
GFR calc Af Amer: >60 mL/min (ref >60)
GFR calc non Af Amer: >60 mL/min (ref >60)
Glucose, Bld: 97 mg/dL (ref 70–99)
Potassium: 3.2 mmol/L — ABNORMAL LOW (ref 3.5–5.1)
Sodium: 138 mmol/L (ref 135–145)

## 2018-05-03 LAB — CBC
HCT: 29.6 % — ABNORMAL LOW (ref 36.0–46.0)
Hemoglobin: 8.3 g/dL — ABNORMAL LOW (ref 12.0–15.0)
MCH: 18.9 pg — AB (ref 26.0–34.0)
MCHC: 28 g/dL — ABNORMAL LOW (ref 30.0–36.0)
MCV: 67.3 fL — ABNORMAL LOW (ref 80.0–100.0)
Platelets: 507 10*3/uL — ABNORMAL HIGH (ref 150–400)
RBC: 4.4 MIL/uL (ref 3.87–5.11)
RDW: 17.4 % — ABNORMAL HIGH (ref 11.5–15.5)
WBC: 5.1 10*3/uL (ref 4.0–10.5)
nRBC: 0 % (ref 0.0–0.2)

## 2018-05-03 LAB — POCT PREGNANCY, URINE: Preg Test, Ur: NEGATIVE

## 2018-05-03 LAB — TROPONIN I: Troponin I: <0.03 ng/mL (ref <0.03)

## 2018-05-03 MED ORDER — FERROUS SULFATE 325 (65 FE) MG PO TABS: 325.0000 mg | ORAL_TABLET | Freq: Three times a day (TID) | ORAL | 0 refills | Status: DC

## 2018-05-03 NOTE — ED Provider Notes (Signed)
[facility] Emergency Department Provider Note   ____________________________________________    I have reviewed the triage vital signs and the nursing notes.   HISTORY  Chief Complaint Dizziness    HPI Patricia Bishop is a 42 y.o. female who presents with complaints of lightheadedness intermittently over the last several days.  Patient reports she first noted when ambulating [place] the mall, she became lightheaded, did report some mild chest tightness but that resolved with rest.  She reports when she is sitting still she has no symptoms.  Only when she exerts herself.  She denies shortness of breath cough fever.  No pleurisy.  No calf pain or swelling.  No nausea or vomiting.  No current chest pain.  No diaphoresis.  Past Medical History:  Diagnosis Date  . Anemia   . Asthma    allergy induced   . Heart palpitations   . Menorrhagia   . Oligomenorrhea   . Thyromegaly     Patient Active Problem List   Diagnosis Date Noted  . Left shoulder pain 01/03/2016  . Right elbow pain 01/03/2016  . Allergic rhinitis 07/11/2015  . Airway hyperreactivity 07/11/2015  . Headache, migraine 07/11/2015  . Carpal tunnel syndrome, right 07/11/2015  . Trigger finger, acquired 02/01/2015  . Right shoulder pain 08/04/2014    History reviewed. No pertinent surgical history.  Prior to Admission medications   Medication Sig Start Date End Date Taking? Authorizing Provider  albuterol (PROAIR HFA) 108 (90 Base) MCG/ACT inhaler Inhale into the lungs. 01/16/15   [provider]  azithromycin (ZITHROMAX) 250 MG tablet Take first 2 tablets together, then 1 every day until finished. 06/01/17   Zigmund Gottron, NP  ferrous sulfate 325 (65 FE) MG tablet Take 1 tablet (325 mg total) by mouth 3 (three) times daily with meals. 05/03/18 05/03/19  Lavonia Drafts, MD  ibuprofen (ADVIL,MOTRIN) 600 MG tablet Take 1 tablet (600 mg total) by mouth every 6 (six) hours as  needed. 03/14/18   Laban Emperor, PA-C  meloxicam (MOBIC) 15 MG tablet Take 1 tablet (15 mg total) by mouth daily. 01/15/18   Cuthriell, Charline Bills, PA-C  methocarbamol (ROBAXIN) 500 MG tablet Take 1 tablet (500 mg total) by mouth 4 (four) times daily. 01/15/18   Cuthriell, Charline Bills, PA-C  mometasone-formoterol (DULERA) 100-5 MCG/ACT AERO Inhale into the lungs. 12/19/15   [provider]  predniSONE (DELTASONE) 10 MG tablet Take 6 tablets on day 1, take 5 tablets on day 2, take 4 tablets on day 3, take 3 tablets on day 4, take 2 tablets on day 5, take 1 tablet on day 6 03/14/18   Laban Emperor, PA-C     Allergies Other  Family History  Problem Relation Age of Onset  . Hypertension Mother   . Cancer Maternal Grandmother   . Cancer Paternal Grandfather     Social History Social History   Tobacco Use  . Smoking status: Never Smoker  . Smokeless tobacco: Never Used  Substance Use Topics  . Alcohol use: No    Alcohol/week: 0.0 standard drinks  . Drug use: No    Review of Systems  Constitutional: No fever/chills Eyes: No visual changes.  ENT: No sore throat. Cardiovascular: As above Respiratory: Denies shortness of breath. Gastrointestinal: No abdominal pain.  No nausea, no vomiting.   Genitourinary: Negative for dysuria. Musculoskeletal: Negative for back pain. Skin: Negative for rash. Neurological: Negative for headaches   ____________________________________________   PHYSICAL EXAM:  VITAL  SIGNS: ED Triage Vitals  Enc Vitals Group     BP 05/03/18 1420 (!) 166/98     Pulse Rate 05/03/18 1420 75     Resp 05/03/18 1420 18     Temp 05/03/18 1420 98.6 F (37 C)     Temp Source 05/03/18 1420 Oral     SpO2 05/03/18 1420 100 %     Weight 05/03/18 1421 89.8 kg (198 lb)     Height 05/03/18 1421 1.626 m (5\' 4" )     Head Circumference --      Peak Flow --      Pain Score 05/03/18 1420 3     Pain Loc --      Pain Edu? --      Excl. in [place]? --      Constitutional: Alert and oriented. No acute distress.  Eyes: Conjunctivae are normal.   Nose: No congestion/rhinnorhea. Mouth/Throat: Mucous membranes are moist.    Cardiovascular: Normal rate, regular rhythm. Grossly normal heart sounds.  Good peripheral circulation. Respiratory: Normal respiratory effort.  No retractions. Lungs CTAB. Gastrointestinal: Soft and nontender. No distention.  No CVA tenderness.  Musculoskeletal: No lower extremity tenderness nor edema.  Warm and well perfused Neurologic:  Normal speech and language. No gross focal neurologic deficits are appreciated.  Skin:  Skin is warm, dry and intact. No rash noted. Psychiatric: Mood and affect are normal. Speech and behavior are normal.  ____________________________________________   LABS (all labs ordered are listed, but only abnormal results are displayed)  Labs Reviewed  BASIC METABOLIC PANEL - Abnormal; Notable for the following components:      Result Value   Potassium 3.2 (*)    Calcium 8.6 (*)    All other components within normal limits  CBC - Abnormal; Notable for the following components:   Hemoglobin 8.3 (*)    HCT 29.6 (*)    MCV 67.3 (*)    MCH 18.9 (*)    MCHC 28.0 (*)    RDW 17.4 (*)    Platelets 507 (*)    All other components within normal limits  TROPONIN I  POC URINE PREG, ED  POCT PREGNANCY, URINE   ____________________________________________  EKG  ED ECG REPORT I, Lavonia Drafts, the attending physician, personally viewed and interpreted this ECG.  Date: 05/03/2018  Rhythm: normal sinus rhythm QRS Axis: normal Intervals: normal ST/T Wave abnormalities: normal Narrative Interpretation: no evidence of acute ischemia  ____________________________________________  RADIOLOGY  Chest x-ray normal ____________________________________________   PROCEDURES  Procedure(s) performed: No  Procedures   Critical Care performed:  No ____________________________________________   INITIAL IMPRESSION / ASSESSMENT AND PLAN / ED COURSE  Pertinent labs & imaging results that were available during my care of the patient were reviewed by me and considered in my medical decision making (see chart for details).  Presents with complaints of dizziness, lightheadedness with exertion only.  Currently feels quite well sitting up in bed with normal exam, normal EKG, normal troponin, no cardiac risk factors.  Hemoglobin noted to be 8.3 down significantly from last record that we have although this is somewhat old.  Suspect iron deficiency given microcytic anemia.  Guaiac negative on exam.  We will have the patient follow-up with hematology, will start iron supplementation.    ____________________________________________   FINAL CLINICAL IMPRESSION(S) / ED DIAGNOSES  Final diagnoses:  Symptomatic anemia        Note:  This document was prepared using Dragon voice recognition software and may include unintentional dictation  errors.    Lavonia Drafts, MD 05/03/18 1754

## 2018-05-03 NOTE — ED Triage Notes (Signed)
Pt c/o chest pain with dizziness and SOB since Thursday. Pt is in NAD on arrival. Respirations WNL

## 2018-05-08 DIAGNOSIS — D649 Anemia, unspecified: Secondary | ICD-10-CM | POA: Insufficient documentation

## 2018-05-08 NOTE — Progress Notes (Deleted)
[name]  [phone] [phone]  ID: Patricia Bishop OB: 1976-01-15  MR#: [id number]  EQA#:[id number]  Patient Care Team: Virginia Rochester, [place] as PCP - General (Family Medicine)  CHIEF COMPLAINT: Anemia, unspecified.  INTERVAL HISTORY: ***  REVIEW OF SYSTEMS:   ROS  As per HPI. Otherwise, a complete review of systems is negative.  PAST MEDICAL HISTORY: Past Medical History:  Diagnosis Date  . Anemia   . Asthma    allergy induced   . Heart palpitations   . Menorrhagia   . Oligomenorrhea   . Thyromegaly     PAST SURGICAL HISTORY: No past surgical history on file.  FAMILY HISTORY: Family History  Problem Relation Age of Onset  . Hypertension Mother   . Cancer Maternal Grandmother   . Cancer Paternal Grandfather     ADVANCED DIRECTIVES (Y/N):  N  HEALTH MAINTENANCE: Social History   Tobacco Use  . Smoking status: Never Smoker  . Smokeless tobacco: Never Used  Substance Use Topics  . Alcohol use: No    Alcohol/week: 0.0 standard drinks  . Drug use: No     Colonoscopy:  PAP:  Bone density:  Lipid panel:  Allergies  Allergen Reactions  . Other     Seasonal     Current Outpatient Medications  Medication Sig Dispense Refill  . albuterol (PROAIR HFA) 108 (90 Base) MCG/ACT inhaler Inhale into the lungs.    Marland Kitchen azithromycin (ZITHROMAX) 250 MG tablet Take first 2 tablets together, then 1 every day until finished. 6 tablet 0  . ferrous sulfate 325 (65 FE) MG tablet Take 1 tablet (325 mg total) by mouth 3 (three) times daily with meals. 90 tablet 0  . ibuprofen (ADVIL,MOTRIN) 600 MG tablet Take 1 tablet (600 mg total) by mouth every 6 (six) hours as needed. 30 tablet 0  . meloxicam (MOBIC) 15 MG tablet Take 1 tablet (15 mg total) by mouth daily. 30 tablet 0  . methocarbamol (ROBAXIN) 500 MG tablet Take 1 tablet (500 mg total) by mouth 4 (four) times daily. 16 tablet 0  . mometasone-formoterol (DULERA) 100-5 MCG/ACT  AERO Inhale into the lungs.    . predniSONE (DELTASONE) 10 MG tablet Take 6 tablets on day 1, take 5 tablets on day 2, take 4 tablets on day 3, take 3 tablets on day 4, take 2 tablets on day 5, take 1 tablet on day 6 21 tablet 0   No current facility-administered medications for this visit.     OBJECTIVE: There were no vitals filed for this visit.   There is no height or weight on file to calculate BMI.    ECOG FS:{CHL ONC [id number]  General: Well-developed, well-nourished, no acute distress. Eyes: Pink conjunctiva, anicteric sclera. HEENT: Normocephalic, moist mucous membranes, clear oropharnyx. Lungs: Clear to auscultation bilaterally. Heart: Regular rate and rhythm. No rubs, murmurs, or gallops. Abdomen: Soft, nontender, nondistended. No organomegaly noted, normoactive bowel sounds. Musculoskeletal: No edema, cyanosis, or clubbing. Neuro: Alert, answering all questions appropriately. Cranial nerves grossly intact. Skin: No rashes or petechiae noted. Psych: Normal affect. Lymphatics: No cervical, calvicular, axillary or inguinal LAD.   LAB RESULTS:  Lab Results  Component Value Date   NA 138 05/03/2018   K 3.2 (L) 05/03/2018   CL 104 05/03/2018   CO2 27 05/03/2018   GLUCOSE 97 05/03/2018   BUN 11 05/03/2018   CREATININE 0.66 05/03/2018   CALCIUM 8.6 (L) 05/03/2018   GFRNONAA >60 05/03/2018   GFRAA >60 05/03/2018  Lab Results  Component Value Date   WBC 5.1 05/03/2018   NEUTROABS 2.6 08/17/2007   HGB 8.3 (L) 05/03/2018   HCT 29.6 (L) 05/03/2018   MCV 67.3 (L) 05/03/2018   PLT 507 (H) 05/03/2018     STUDIES: Dg Chest 2 View  Result Date: 05/03/2018 CLINICAL DATA:  Patient with anterior chest pain. EXAM: CHEST - 2 VIEW COMPARISON:  Chest radiograph 08/17/2007 FINDINGS: Stable cardiac and mediastinal contours. No consolidative pulmonary opacities. No pleural effusion or pneumothorax. Regional skeleton is unremarkable. IMPRESSION: No acute cardiopulmonary  process. Electronically Signed   By: Lovey Newcomer M.D.   On: 05/03/2018 15:36    ASSESSMENT: Anemia, unspecified  PLAN:    1. Anemia, unspecified:  Patient expressed understanding and was in agreement with this plan. She also understands that She can call clinic at any time with any questions, concerns, or complaints.   Cancer Staging No matching staging information was found for the patient.  Lloyd Huger, MD   05/08/2018 2:05 PM

## 2018-05-12 ENCOUNTER — Telehealth: Payer: Self-pay | Admitting: Oncology

## 2018-05-12 NOTE — Telephone Encounter (Signed)
Patient called and cancelled appt. Patient stated that she does not wish to reschedule appt.

## 2018-05-14 ENCOUNTER — Encounter: Payer: BLUE CROSS/BLUE SHIELD | Admitting: Oncology

## 2018-05-14 DIAGNOSIS — D500 Iron deficiency anemia secondary to blood loss (chronic): Secondary | ICD-10-CM | POA: Insufficient documentation

## 2018-06-17 DIAGNOSIS — D259 Leiomyoma of uterus, unspecified: Secondary | ICD-10-CM | POA: Diagnosis not present

## 2018-06-17 DIAGNOSIS — N939 Abnormal uterine and vaginal bleeding, unspecified: Secondary | ICD-10-CM | POA: Diagnosis not present

## 2018-06-17 DIAGNOSIS — D649 Anemia, unspecified: Secondary | ICD-10-CM | POA: Diagnosis not present

## 2018-06-17 DIAGNOSIS — E569 Vitamin deficiency, unspecified: Secondary | ICD-10-CM | POA: Diagnosis not present

## 2018-07-20 DIAGNOSIS — G5601 Carpal tunnel syndrome, right upper limb: Secondary | ICD-10-CM | POA: Diagnosis not present

## 2018-07-20 DIAGNOSIS — M25532 Pain in left wrist: Secondary | ICD-10-CM | POA: Diagnosis not present

## 2018-07-20 DIAGNOSIS — M25531 Pain in right wrist: Secondary | ICD-10-CM | POA: Diagnosis not present

## 2018-07-20 DIAGNOSIS — G5602 Carpal tunnel syndrome, left upper limb: Secondary | ICD-10-CM | POA: Diagnosis not present

## 2018-07-20 DIAGNOSIS — M25521 Pain in right elbow: Secondary | ICD-10-CM | POA: Diagnosis not present

## 2018-08-03 DIAGNOSIS — D259 Leiomyoma of uterus, unspecified: Secondary | ICD-10-CM | POA: Diagnosis not present

## 2018-08-03 DIAGNOSIS — Z09 Encounter for follow-up examination after completed treatment for conditions other than malignant neoplasm: Secondary | ICD-10-CM | POA: Diagnosis not present

## 2018-08-03 DIAGNOSIS — N939 Abnormal uterine and vaginal bleeding, unspecified: Secondary | ICD-10-CM | POA: Diagnosis not present

## 2018-08-20 DIAGNOSIS — J4530 Mild persistent asthma, uncomplicated: Secondary | ICD-10-CM | POA: Diagnosis not present

## 2018-12-23 DIAGNOSIS — R002 Palpitations: Secondary | ICD-10-CM | POA: Diagnosis not present

## 2018-12-24 DIAGNOSIS — R9431 Abnormal electrocardiogram [ECG] [EKG]: Secondary | ICD-10-CM | POA: Diagnosis not present

## 2019-01-07 DIAGNOSIS — R9431 Abnormal electrocardiogram [ECG] [EKG]: Secondary | ICD-10-CM | POA: Diagnosis not present

## 2019-01-07 DIAGNOSIS — Z6834 Body mass index (BMI) 34.0-34.9, adult: Secondary | ICD-10-CM | POA: Diagnosis not present

## 2019-01-07 DIAGNOSIS — E6609 Other obesity due to excess calories: Secondary | ICD-10-CM | POA: Diagnosis not present

## 2019-01-07 DIAGNOSIS — R002 Palpitations: Secondary | ICD-10-CM | POA: Diagnosis not present

## 2019-01-15 ENCOUNTER — Emergency Department: Payer: BC Managed Care – PPO

## 2019-01-15 ENCOUNTER — Other Ambulatory Visit: Payer: Self-pay

## 2019-01-15 ENCOUNTER — Emergency Department
Admission: EM | Admit: 2019-01-15 | Discharge: 2019-01-15 | Disposition: A | Discharge status: Home / Self Care | Payer: BC Managed Care – PPO | Attending: Student | Admitting: Student

## 2019-01-15 DIAGNOSIS — R5383 Other fatigue: Secondary | ICD-10-CM | POA: Diagnosis not present

## 2019-01-15 DIAGNOSIS — R42 Dizziness and giddiness: Secondary | ICD-10-CM | POA: Insufficient documentation

## 2019-01-15 DIAGNOSIS — Z79899 Other long term (current) drug therapy: Secondary | ICD-10-CM | POA: Insufficient documentation

## 2019-01-15 DIAGNOSIS — D500 Iron deficiency anemia secondary to blood loss (chronic): Secondary | ICD-10-CM | POA: Diagnosis not present

## 2019-01-15 DIAGNOSIS — R002 Palpitations: Secondary | ICD-10-CM | POA: Insufficient documentation

## 2019-01-15 DIAGNOSIS — J45909 Unspecified asthma, uncomplicated: Secondary | ICD-10-CM | POA: Diagnosis not present

## 2019-01-15 DIAGNOSIS — R079 Chest pain, unspecified: Secondary | ICD-10-CM | POA: Diagnosis not present

## 2019-01-15 LAB — BASIC METABOLIC PANEL
Anion gap: 8 (ref 5–15)
BUN: 8 mg/dL (ref 6–20)
CO2: 25 mmol/L (ref 22–32)
Calcium: 8.7 mg/dL — ABNORMAL LOW (ref 8.9–10.3)
Chloride: 105 mmol/L (ref 98–111)
Creatinine, Ser: 0.68 mg/dL (ref 0.44–1.00)
GFR calc Af Amer: >60 mL/min (ref >60)
GFR calc non Af Amer: >60 mL/min (ref >60)
Glucose, Bld: 94 mg/dL (ref 70–99)
Potassium: 3.4 mmol/L — ABNORMAL LOW (ref 3.5–5.1)
Sodium: 138 mmol/L (ref 135–145)

## 2019-01-15 LAB — CBC
HCT: 30.2 % — ABNORMAL LOW (ref 36.0–46.0)
Hemoglobin: 8.5 g/dL — ABNORMAL LOW (ref 12.0–15.0)
MCH: 18.5 pg — ABNORMAL LOW (ref 26.0–34.0)
MCHC: 28.1 g/dL — ABNORMAL LOW (ref 30.0–36.0)
MCV: 65.7 fL — ABNORMAL LOW (ref 80.0–100.0)
Platelets: 458 10*3/uL — ABNORMAL HIGH (ref 150–400)
RBC: 4.6 MIL/uL (ref 3.87–5.11)
RDW: 22.6 % — ABNORMAL HIGH (ref 11.5–15.5)
WBC: 4.5 10*3/uL (ref 4.0–10.5)
nRBC: 0 % (ref 0.0–0.2)

## 2019-01-15 LAB — TSH: TSH: 3.023 u[IU]/mL (ref 0.350–4.500)

## 2019-01-15 LAB — HCG, QUANTITATIVE, PREGNANCY: hCG, Beta Chain, Quant, S: <1 m[IU]/mL (ref <5)

## 2019-01-15 LAB — TROPONIN I (HIGH SENSITIVITY): Troponin I (High Sensitivity): 2 ng/L (ref <18)

## 2019-01-15 MED ORDER — CENTRUM PO CHEW: 1.0000 | CHEWABLE_TABLET | Freq: Every day | ORAL | 0 refills | Status: AC

## 2019-01-15 NOTE — Discharge Instructions (Signed)
Thank you for letting us take care of you in the emergency department today.   Please continue to take any regular, prescribed medications.   New medications we have prescribed:  - Multivitamin with iron, please take as directed  Please follow up with: - Your OB/GYN and your cardiologist - Your primary care doctor to review your ER visit and follow up on your symptoms.   Please return to the ER for any new or worsening symptoms.

## 2019-01-15 NOTE — ED Provider Notes (Signed)
[facility] Emergency Department Provider Note  ____________________________________________   First MD Initiated Contact with Patient 01/15/19 1748     (approximate)  I have reviewed the triage vital signs and the nursing notes.  History  Chief Complaint Chest Pain    HPI Patricia Bishop is a 43 y.o. female with history of allergy induced asthma, anemia who presents to the ED for palpitations, feeling fatigued/"off". She states she has had intermittent episodes of palpitations w/ associated chest discomfort that have been ongoing for at least 1 month. She has seen a cardiologist, with plans for an echocardiogram and Holter monitor. On arrival to the ED, she does not have an palpitations. She denies any shortness of breath, but does often feel more fatigued w/ exertion than normal. No fevers, vomiting, diarrhea, urinary symptoms. No sick contacts. She does note a history of fibroids with heavy menses.          Past Medical Hx Past Medical History:  Diagnosis Date  . Anemia   . Asthma    allergy induced   . Heart palpitations   . Menorrhagia   . Oligomenorrhea   . Thyromegaly     Problem List Patient Active Problem List   Diagnosis Date Noted  . Anemia, unspecified 05/08/2018  . Left shoulder pain 01/03/2016  . Right elbow pain 01/03/2016  . Allergic rhinitis 07/11/2015  . Airway hyperreactivity 07/11/2015  . Headache, migraine 07/11/2015  . Carpal tunnel syndrome, right 07/11/2015  . Trigger finger, acquired 02/01/2015  . Right shoulder pain 08/04/2014    Past Surgical Hx History reviewed. No pertinent surgical history.  Medications Prior to Admission medications   Medication Sig Start Date End Date Taking? Authorizing Provider  albuterol (PROAIR HFA) 108 (90 Base) MCG/ACT inhaler Inhale into the lungs. 01/16/15   [provider]  azithromycin (ZITHROMAX) 250 MG tablet Take first 2 tablets together, then 1 every day until  finished. 06/01/17   Zigmund Gottron, NP  ferrous sulfate 325 (65 FE) MG tablet Take 1 tablet (325 mg total) by mouth 3 (three) times daily with meals. 05/03/18 05/03/19  Lavonia Drafts, MD  ibuprofen (ADVIL,MOTRIN) 600 MG tablet Take 1 tablet (600 mg total) by mouth every 6 (six) hours as needed. 03/14/18   Laban Emperor, PA-C  meloxicam (MOBIC) 15 MG tablet Take 1 tablet (15 mg total) by mouth daily. 01/15/18   Cuthriell, Charline Bills, PA-C  methocarbamol (ROBAXIN) 500 MG tablet Take 1 tablet (500 mg total) by mouth 4 (four) times daily. 01/15/18   Cuthriell, Charline Bills, PA-C  mometasone-formoterol (DULERA) 100-5 MCG/ACT AERO Inhale into the lungs. 12/19/15   [provider]  multivitamin-iron-minerals-folic acid (CENTRUM) chewable tablet Chew 1 tablet by mouth daily. 01/15/19 02/14/19  Lilia Pro., MD  predniSONE (DELTASONE) 10 MG tablet Take 6 tablets on day 1, take 5 tablets on day 2, take 4 tablets on day 3, take 3 tablets on day 4, take 2 tablets on day 5, take 1 tablet on day 6 03/14/18   Laban Emperor, PA-C    Allergies Other  Family Hx Family History  Problem Relation Age of Onset  . Hypertension Mother   . Cancer Maternal Grandmother   . Cancer Paternal Grandfather     Social Hx Social History   Tobacco Use  . Smoking status: Never Smoker  . Smokeless tobacco: Never Used  Substance Use Topics  . Alcohol use: No    Alcohol/week: 0.0 standard drinks  . Drug use:  No     Review of Systems  Constitutional: Negative for fever. Negative for chills. Eyes: Negative for visual changes. ENT: Negative for sore throat. Cardiovascular: Negative for chest pain. + palpitations Respiratory: Negative for shortness of breath. Gastrointestinal: Negative for abdominal pain. Negative for nausea. Negative for vomiting. Genitourinary: Negative for dysuria. Musculoskeletal: Negative for leg swelling. Skin: Negative for rash. Neurological: Negative for for headaches.    Physical Exam  Vital Signs: ED Triage Vitals  Enc Vitals Group     BP 01/15/19 1251 (!) 176/93     Pulse Rate 01/15/19 1250 78     Resp 01/15/19 1250 18     Temp 01/15/19 1251 98.5 F (36.9 C)     Temp Source 01/15/19 1250 Oral     SpO2 01/15/19 1250 100 %     Weight --      Height --      Head Circumference --      Peak Flow --      Pain Score 01/15/19 1250 4     Pain Loc --      Pain Edu? --      Excl. in [place]? --     Constitutional: Alert and oriented.  Eyes: Conjunctivae clear. Sclera anicteric. Head: Normocephalic. Atraumatic. Nose: No congestion. No rhinorrhea. Mouth/Throat: Mucous membranes are moist.  Neck: No stridor.   Cardiovascular: Normal rate, regular rhythm. No murmurs. Extremities well perfused. Respiratory: Normal respiratory effort.  Lungs CTAB. Gastrointestinal: Soft and non-tender. No distention.  Musculoskeletal: No lower extremity edema. Neurologic:  Normal speech and language. No gross focal neurologic deficits are appreciated.  Skin: Skin is warm, dry and intact. No rash noted. Psychiatric: Mood and affect are appropriate for situation.  EKG  Personally reviewed.   Rate: 71 Rhythm: sinus Axis: normal Intervals: WNL No acute ischemic changes No evidence of Brug[place], WPW, or prolonged QTc No STEMI    Radiology  XR: IMPRESSION: No acute abnormality of the lungs.   Procedures  Procedure(s) performed (including critical care):  Procedures   Initial Impression / Assessment and Plan / ED Course  43 y.o. female who presents to the ED for palpitations, chest discomfort, fatigue, as above.  Ddx: arrhythmia, electrolyte abnormality, thyroid dysfnx, anemia  Plan: labs, EKG, imaging  Lab work reveals anemia, similar to prior. Microcytic, suspect related to her heavy menses and at least contributing to some of her symptoms today. Remainder of work up w/o actionable derangements - negative troponin, EKG w/o ischemic or arrhythmia. TSH  WNL. As such, will plan for discharge with Rx for multivitamin w/ iron, and advised PCP and OB follow up, as well as continued follow up with cardiology which she already has in place. Patient agreeable w/ plan and discharge.    Final Clinical Impression(s) / ED Diagnosis  Final diagnoses:  Palpitations  Lightheadedness  Iron deficiency anemia due to chronic blood loss       Note:  This document was prepared using Dragon voice recognition software and may include unintentional dictation errors.   Lilia Pro., MD 01/15/19 [phone]

## 2019-01-15 NOTE — ED Triage Notes (Signed)
Reports chest pain and palpitations, lightheadedness that started today. Recurrent X 1 month. Pt alert and oriented X4, cooperative, RR even and unlabored, color WNL. Pt in NAD.

## 2019-02-11 DIAGNOSIS — R002 Palpitations: Secondary | ICD-10-CM | POA: Diagnosis not present

## 2019-02-15 DIAGNOSIS — R002 Palpitations: Secondary | ICD-10-CM | POA: Diagnosis not present

## 2019-03-12 DIAGNOSIS — G5603 Carpal tunnel syndrome, bilateral upper limbs: Secondary | ICD-10-CM | POA: Diagnosis not present

## 2019-03-26 DIAGNOSIS — M25532 Pain in left wrist: Secondary | ICD-10-CM | POA: Diagnosis not present

## 2019-03-26 DIAGNOSIS — M25531 Pain in right wrist: Secondary | ICD-10-CM | POA: Diagnosis not present

## 2019-04-09 DIAGNOSIS — M25532 Pain in left wrist: Secondary | ICD-10-CM | POA: Diagnosis not present

## 2019-06-07 IMAGING — US US EXTREM  UP VENOUS*R*
1 series · 13 of 24 positions shown · non-contrast
Comparison: None.

CLINICAL DATA: Right arm swelling for 2 days.



[Series 1: us extrem up venous*right* · 13 of 34 slices shown]
[im 1/34]
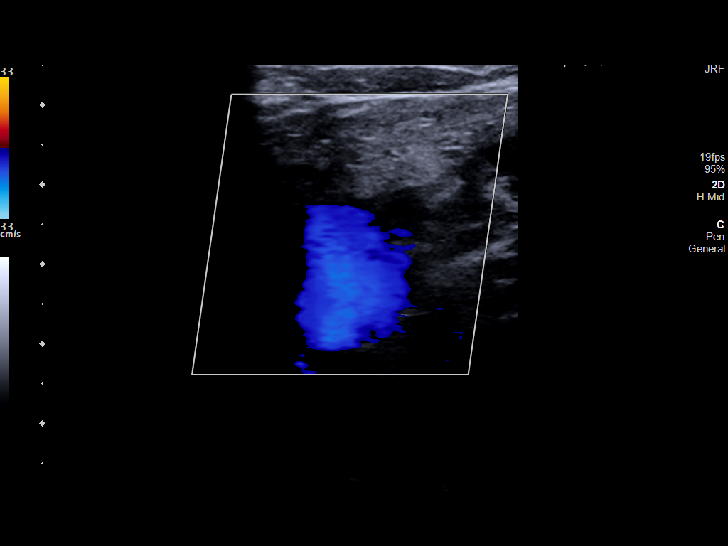
[im 3/34]
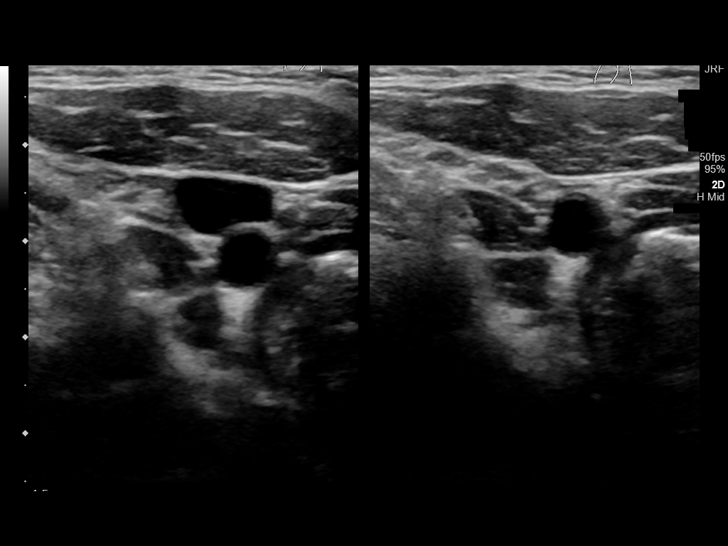
[im 6/34]
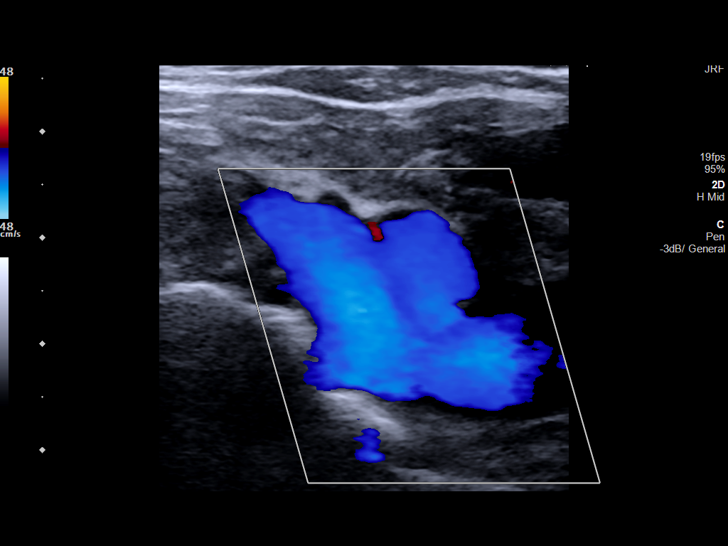
[im 9/34]
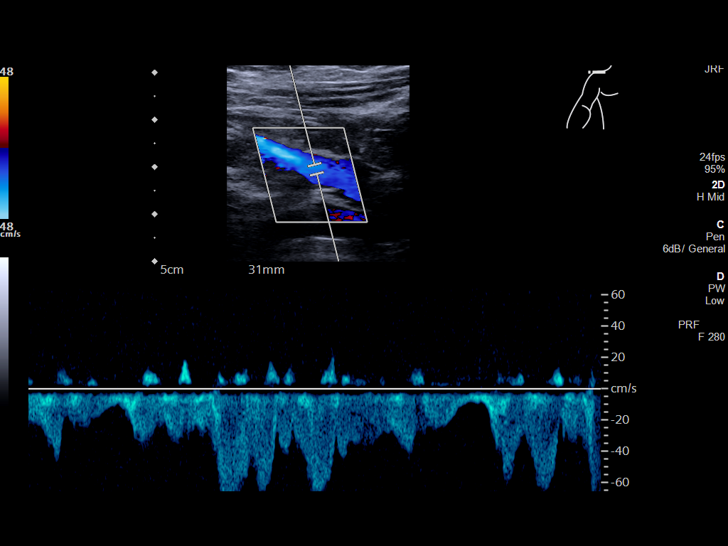
[im 12/34]
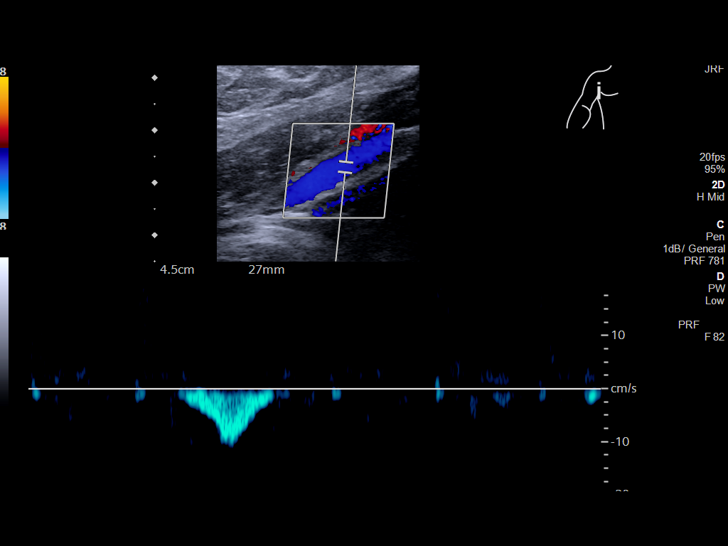
[im 15/34]
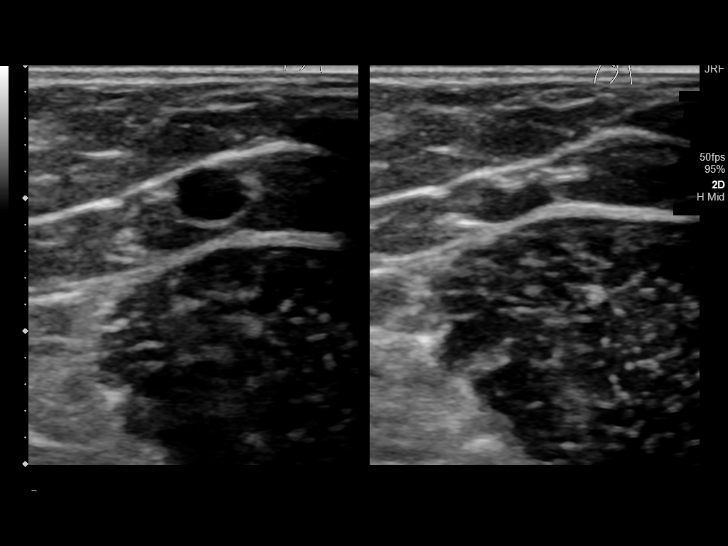
[im 18/34]
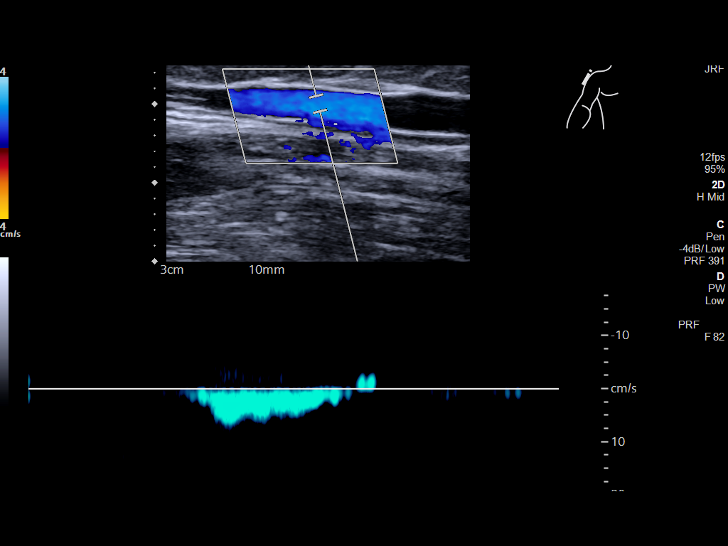
[im 19/34]
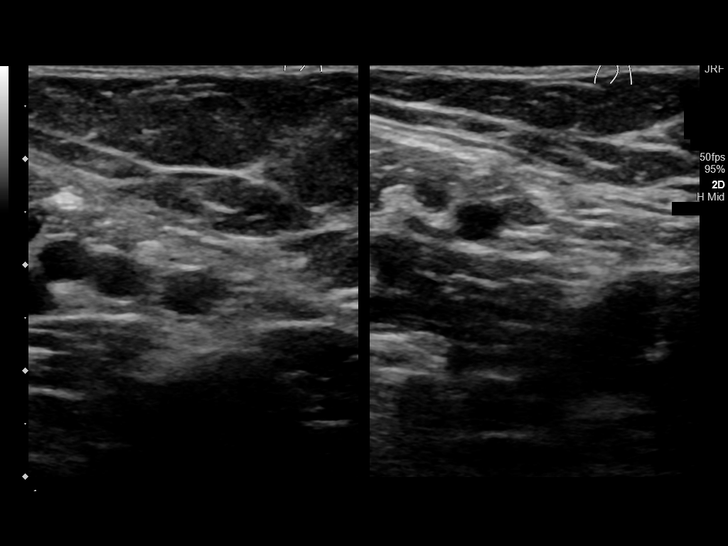
[im 22/34]
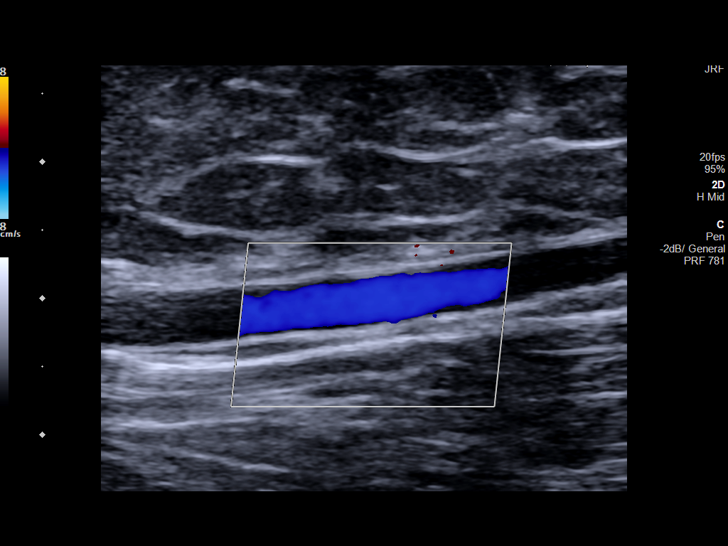
[im 25/34]
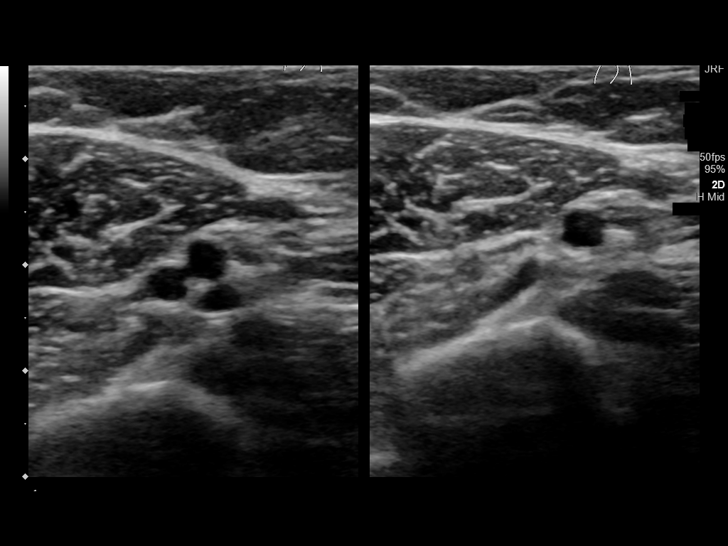
[im 28/34]
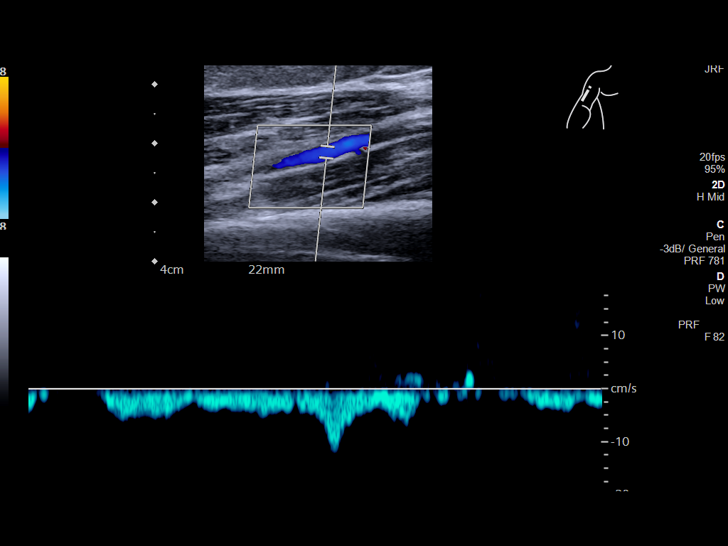
[im 31/34]
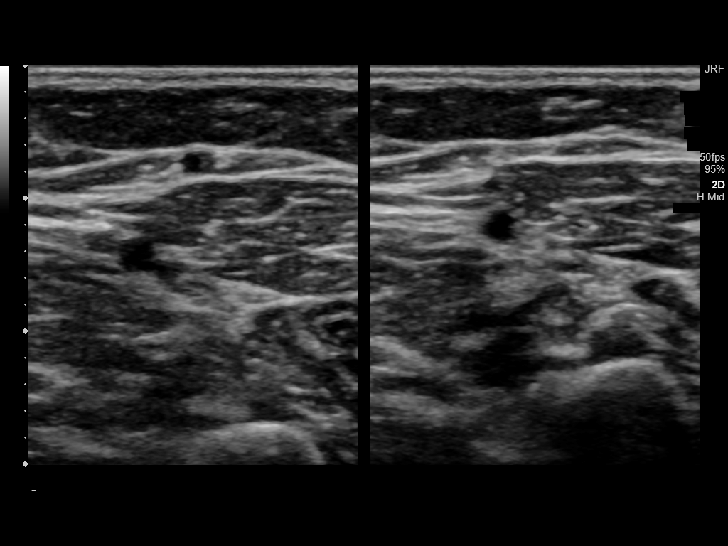
[im 34/34]
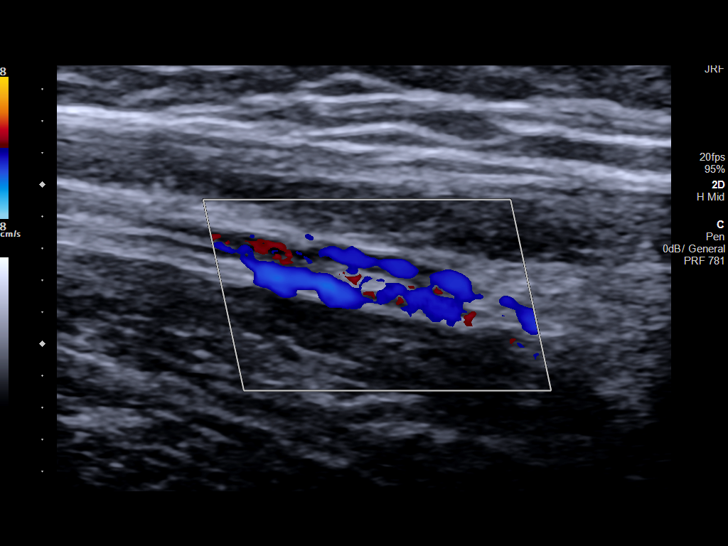

[13 of 24 positions shown; findings below may reference images not displayed]

FINDINGS: Contralateral Subclavian Vein: No evidence of thrombus. Normal color
Doppler flow and respiratory phasicity.

Internal Jugular Vein: No evidence of thrombus. Normal
compressibility, respiratory phasicity and color Doppler flow.

Subclavian Vein: No evidence of thrombus. Normal color Doppler flow,
respiratory phasicity and response to augmentation.

Axillary Vein: No evidence of thrombus. Normal compressibility,
respiratory phasicity and response to augmentation.

Cephalic Vein: No evidence of thrombus. Normal compressibility,
respiratory phasicity and response to augmentation.

Basilic Vein: No evidence of thrombus. Normal compressibility,
respiratory phasicity and response to augmentation.

Brachial Veins: No evidence of thrombus. Normal compressibility,
respiratory phasicity and response to augmentation.

Radial Veins: No evidence of thrombus. Normal compressibility and
color Doppler flow.

Ulnar Veins: No evidence of thrombus. Normal compressibility and
color Doppler flow.

Venous Reflux:  None visualized.

Other Findings:  None visualized.
IMPRESSION: No evidence of DVT within the right upper extremity.

## 2019-07-27 IMAGING — CR DG CHEST 2V
1 series · 2 of 2 positions shown · non-contrast
Comparison: Chest radiograph 08/17/2007

CLINICAL DATA: Patient with anterior chest pain.

EXAM:
CHEST - 2 VIEW

[Series 1: dg chest 2 view · 0.14mm/px · 2 of 2 slices shown]
[im 1/2]
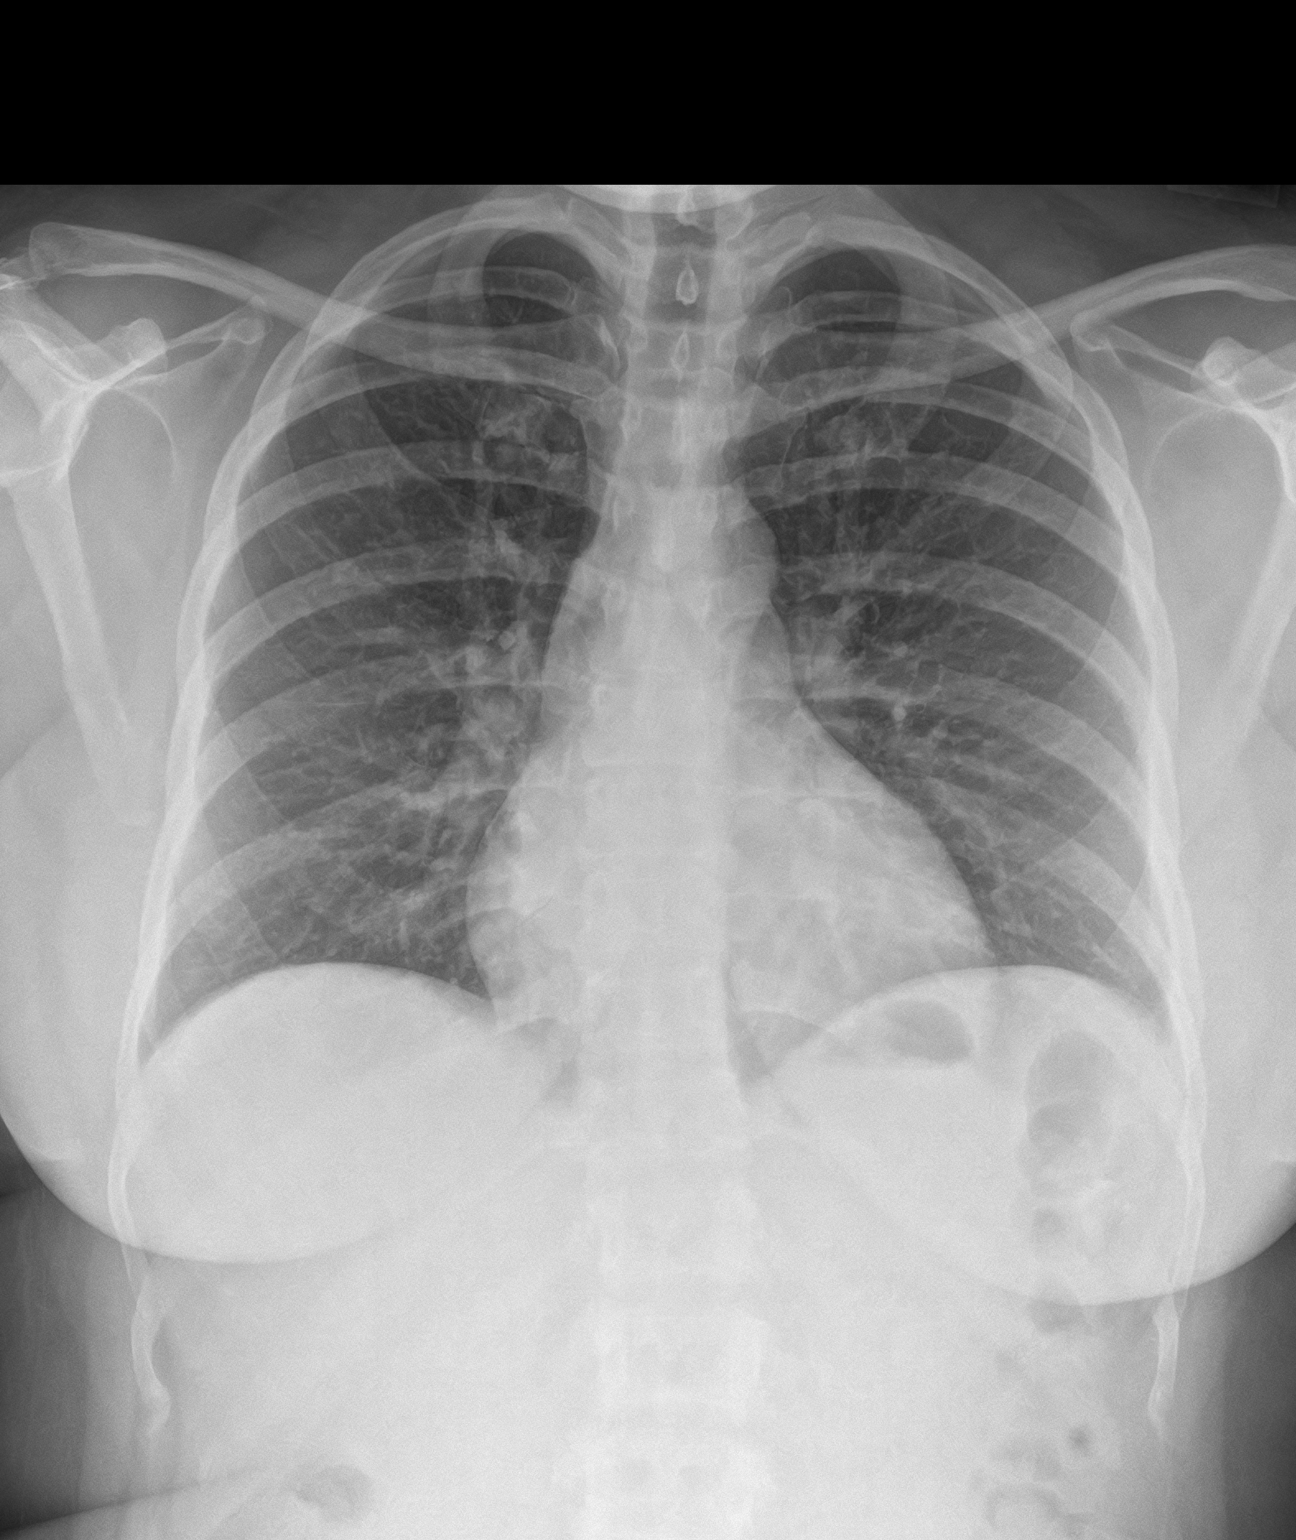
[im 2/2]
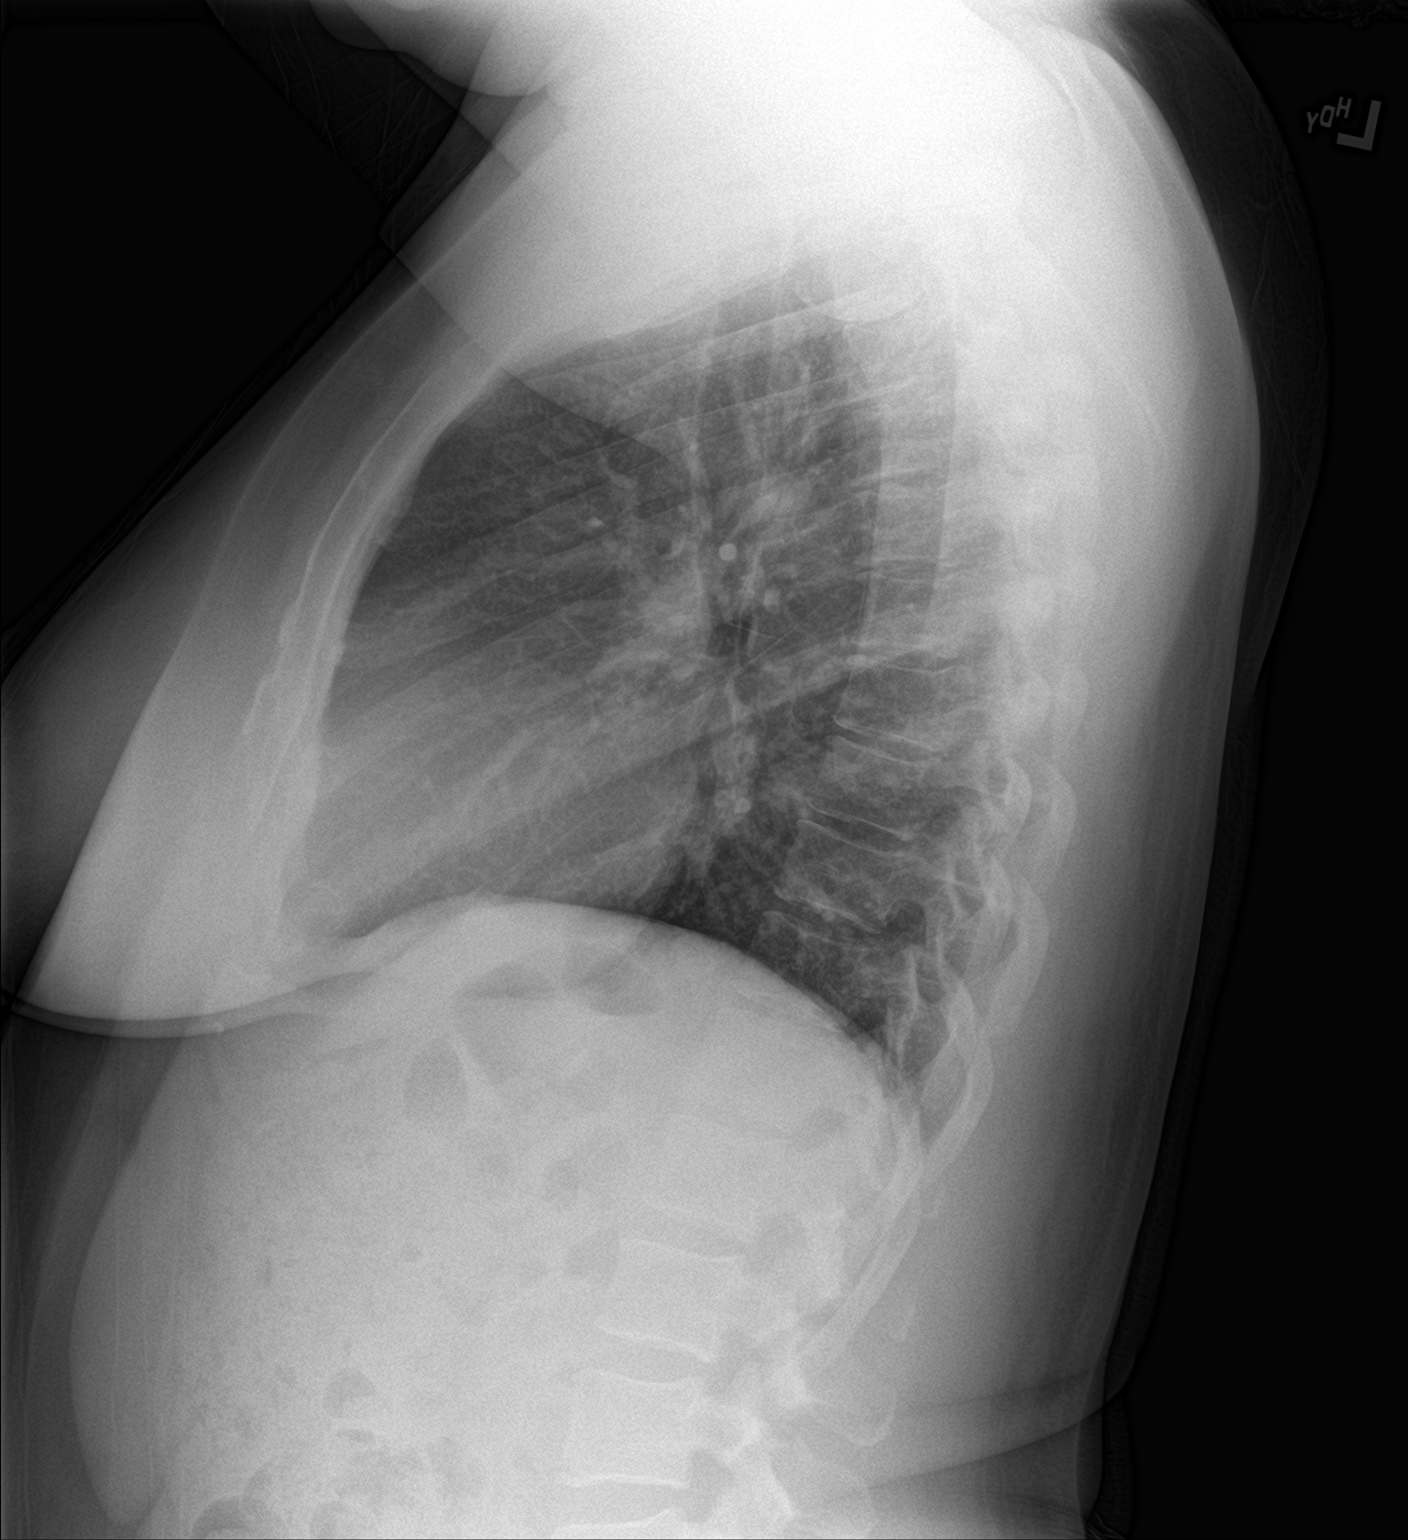

[2 of 2 positions shown; findings below may reference images not displayed]

FINDINGS: Stable cardiac and mediastinal contours. No consolidative pulmonary
opacities. No pleural effusion or pneumothorax. Regional skeleton is
unremarkable.
IMPRESSION: No acute cardiopulmonary process.

## 2019-08-17 DIAGNOSIS — D649 Anemia, unspecified: Secondary | ICD-10-CM | POA: Diagnosis not present

## 2019-08-17 DIAGNOSIS — N946 Dysmenorrhea, unspecified: Secondary | ICD-10-CM | POA: Diagnosis not present

## 2019-08-17 DIAGNOSIS — N926 Irregular menstruation, unspecified: Secondary | ICD-10-CM | POA: Diagnosis not present

## 2019-08-23 ENCOUNTER — Telehealth: Payer: Self-pay | Admitting: Family

## 2019-08-23 NOTE — Telephone Encounter (Signed)
Spoke with patient to confirm referral appointment 4/30 at 130 pm. Patient will call office backif her work is unable to accommodate

## 2019-08-30 ENCOUNTER — Other Ambulatory Visit: Payer: Self-pay | Admitting: Family

## 2019-08-30 DIAGNOSIS — D649 Anemia, unspecified: Secondary | ICD-10-CM

## 2019-08-30 DIAGNOSIS — D508 Other iron deficiency anemias: Secondary | ICD-10-CM

## 2019-08-31 ENCOUNTER — Other Ambulatory Visit: Payer: Self-pay | Admitting: Family

## 2019-08-31 ENCOUNTER — Telehealth: Payer: Self-pay | Admitting: Family

## 2019-08-31 ENCOUNTER — Inpatient Hospital Stay: Payer: BC Managed Care – PPO | Attending: Family

## 2019-08-31 ENCOUNTER — Encounter: Payer: Self-pay | Admitting: Family

## 2019-08-31 ENCOUNTER — Other Ambulatory Visit: Payer: Self-pay

## 2019-08-31 ENCOUNTER — Inpatient Hospital Stay (HOSPITAL_BASED_OUTPATIENT_CLINIC_OR_DEPARTMENT_OTHER): Payer: BC Managed Care – PPO | Admitting: Family

## 2019-08-31 VITALS — BP 141/87 | HR 80 | Temp 97.5°F | Resp 18 | Ht 64.0 in | Wt 205.8 lb

## 2019-08-31 DIAGNOSIS — R002 Palpitations: Secondary | ICD-10-CM | POA: Insufficient documentation

## 2019-08-31 DIAGNOSIS — M7989 Other specified soft tissue disorders: Secondary | ICD-10-CM

## 2019-08-31 DIAGNOSIS — E282 Polycystic ovarian syndrome: Secondary | ICD-10-CM | POA: Diagnosis not present

## 2019-08-31 DIAGNOSIS — R5383 Other fatigue: Secondary | ICD-10-CM | POA: Diagnosis not present

## 2019-08-31 DIAGNOSIS — R42 Dizziness and giddiness: Secondary | ICD-10-CM | POA: Diagnosis not present

## 2019-08-31 DIAGNOSIS — R200 Anesthesia of skin: Secondary | ICD-10-CM

## 2019-08-31 DIAGNOSIS — D649 Anemia, unspecified: Secondary | ICD-10-CM

## 2019-08-31 DIAGNOSIS — Z809 Family history of malignant neoplasm, unspecified: Secondary | ICD-10-CM

## 2019-08-31 DIAGNOSIS — D259 Leiomyoma of uterus, unspecified: Secondary | ICD-10-CM | POA: Insufficient documentation

## 2019-08-31 DIAGNOSIS — M545 Low back pain: Secondary | ICD-10-CM

## 2019-08-31 DIAGNOSIS — Z79899 Other long term (current) drug therapy: Secondary | ICD-10-CM | POA: Diagnosis not present

## 2019-08-31 DIAGNOSIS — Z8249 Family history of ischemic heart disease and other diseases of the circulatory system: Secondary | ICD-10-CM

## 2019-08-31 DIAGNOSIS — R531 Weakness: Secondary | ICD-10-CM

## 2019-08-31 DIAGNOSIS — D500 Iron deficiency anemia secondary to blood loss (chronic): Secondary | ICD-10-CM | POA: Diagnosis not present

## 2019-08-31 DIAGNOSIS — D509 Iron deficiency anemia, unspecified: Secondary | ICD-10-CM | POA: Insufficient documentation

## 2019-08-31 DIAGNOSIS — N920 Excessive and frequent menstruation with regular cycle: Secondary | ICD-10-CM

## 2019-08-31 LAB — CBC WITH DIFFERENTIAL (CANCER CENTER ONLY)
Abs Immature Granulocytes: 0.02 10*3/uL (ref 0.00–0.07)
Basophils Absolute: 0.1 10*3/uL (ref 0.0–0.1)
Basophils Relative: 1 %
Eosinophils Absolute: 0.1 10*3/uL (ref 0.0–0.5)
Eosinophils Relative: 2 %
HCT: 28.1 % — ABNORMAL LOW (ref 36.0–46.0)
Hemoglobin: 8 g/dL — ABNORMAL LOW (ref 12.0–15.0)
Immature Granulocytes: 0 %
Lymphocytes Relative: 32 %
Lymphs Abs: 1.7 10*3/uL (ref 0.7–4.0)
MCH: 18.3 pg — ABNORMAL LOW (ref 26.0–34.0)
MCHC: 28.5 g/dL — ABNORMAL LOW (ref 30.0–36.0)
MCV: 64.2 fL — ABNORMAL LOW (ref 80.0–100.0)
Monocytes Absolute: 0.4 10*3/uL (ref 0.1–1.0)
Monocytes Relative: 8 %
Neutro Abs: 2.8 10*3/uL (ref 1.7–7.7)
Neutrophils Relative %: 57 %
Platelet Count: 424 10*3/uL — ABNORMAL HIGH (ref 150–400)
RBC: 4.38 MIL/uL (ref 3.87–5.11)
RDW: 19.3 % — ABNORMAL HIGH (ref 11.5–15.5)
WBC Count: 5.1 10*3/uL (ref 4.0–10.5)
nRBC: 0 % (ref 0.0–0.2)

## 2019-08-31 LAB — IRON AND TIBC
Iron: 16 ug/dL — ABNORMAL LOW (ref 41–142)
Saturation Ratios: 3 % — ABNORMAL LOW (ref 21–57)
TIBC: 470 ug/dL — ABNORMAL HIGH (ref 236–444)
UIBC: 454 ug/dL — ABNORMAL HIGH (ref 120–384)

## 2019-08-31 LAB — CMP (CANCER CENTER ONLY)
ALT: 11 U/L (ref 0–44)
AST: 10 U/L — ABNORMAL LOW (ref 15–41)
Albumin: 4.2 g/dL (ref 3.5–5.0)
Alkaline Phosphatase: 47 U/L (ref 38–126)
Anion gap: 6 (ref 5–15)
BUN: 11 mg/dL (ref 6–20)
CO2: 26 mmol/L (ref 22–32)
Calcium: 9 mg/dL (ref 8.9–10.3)
Chloride: 106 mmol/L (ref 98–111)
Creatinine: 0.83 mg/dL (ref 0.44–1.00)
GFR, Est AFR Am: >60 mL/min (ref >60)
GFR, Estimated: >60 mL/min (ref >60)
Glucose, Bld: 100 mg/dL — ABNORMAL HIGH (ref 70–99)
Potassium: 3.8 mmol/L (ref 3.5–5.1)
Sodium: 138 mmol/L (ref 135–145)
Total Bilirubin: 0.3 mg/dL (ref 0.3–1.2)
Total Protein: 7.3 g/dL (ref 6.5–8.1)

## 2019-08-31 LAB — FERRITIN: Ferritin: <4 ng/mL — ABNORMAL LOW (ref 11–307)

## 2019-08-31 LAB — SAMPLE TO BLOOD BANK

## 2019-08-31 LAB — LACTATE DEHYDROGENASE: LDH: 153 U/L (ref 98–192)

## 2019-08-31 LAB — RETICULOCYTES
Immature Retic Fract: 15.1 % (ref 2.3–15.9)
RBC.: 4 MIL/uL (ref 3.87–5.11)
Retic Count, Absolute: 45.5 10*3/uL (ref 19.0–186.0)
Retic Ct Pct: 1.1 % (ref 0.4–3.1)

## 2019-08-31 LAB — SAVE SMEAR(SSMR), FOR PROVIDER SLIDE REVIEW

## 2019-08-31 NOTE — Progress Notes (Signed)
Hematology/Oncology Consultation   Name: Patricia Bishop      MRN: [id number]    Location: Room/bed info not found  Date: 08/31/2019 Time:8:37 AM   REFERRING PHYSICIAN: Crawford Givens, MD  REASON FOR CONSULT: Anemia   DIAGNOSIS: Iron deficiency anemia secondary to heavy cycles  HISTORY OF PRESENT ILLNESS: Patricia Bishop is a very pleasant 44 yo African American female with a long history of anemia secondary to heavy cycles.  She has history of fibroids as well as PCOS.  Her cycles are irregular and heavy with lots of cramping pain and clots.  No other blood loss noted. No bruising or petechiae.  She has discussed ablation/embolization vs. Hysterectomy with her gynecologist Dr. Charlesetta Garibaldi and is still considering what she wants to do.  She did not like taking oral iron.  Hgb today is 8.0, MCV 64, platelet count 424.  She is symptomatic with fatigue, weakness, SOB with over exertion, palpitations and dizziness.  She has never required a blood transfusion and has never had IV iron before.  She had a double carpal tunnel repair and her wisdom teeth removed without any complications.  She has 1 child and history of 1 miscarriage at 52 month (stillbirth).  Her daughter has the sickle cell trait.  No family history of anemia.  No personal history of cancer. Family history includes: paternal aunt - ovarian, maternal uncle and grandfather both with prostate.  No fever, chills, n/v, cough, rash, chest pain, abdominal pain or changes in bladder or bowel habits.  She has occasional mild swelling and numbness in the left leg. This comes and goes. No swelling, tenderness, numbness or tingling in her extremities at this time.  She has lower back pain she states due to the fibroids.  No falls or syncopal episodes.  She does not smoke or use recreational drugs. No ETOH.  She has noted that her appetite is down. She is staying well hydrated. Her weight is described as stable.   ROS: All other 10 point review of  systems is negative.   PAST MEDICAL HISTORY:   Past Medical History:  Diagnosis Date  . Anemia   . Asthma    allergy induced   . Heart palpitations   . Menorrhagia   . Oligomenorrhea   . Thyromegaly     ALLERGIES: Allergies  Allergen Reactions  . Other     Seasonal       MEDICATIONS:  Current Outpatient Medications on File Prior to Visit  Medication Sig Dispense Refill  . albuterol (PROAIR HFA) 108 (90 Base) MCG/ACT inhaler Inhale into the lungs.    Marland Kitchen azithromycin (ZITHROMAX) 250 MG tablet Take first 2 tablets together, then 1 every day until finished. 6 tablet 0  . ferrous sulfate 325 (65 FE) MG tablet Take 1 tablet (325 mg total) by mouth 3 (three) times daily with meals. 90 tablet 0  . ibuprofen (ADVIL,MOTRIN) 600 MG tablet Take 1 tablet (600 mg total) by mouth every 6 (six) hours as needed. 30 tablet 0  . meloxicam (MOBIC) 15 MG tablet Take 1 tablet (15 mg total) by mouth daily. 30 tablet 0  . methocarbamol (ROBAXIN) 500 MG tablet Take 1 tablet (500 mg total) by mouth 4 (four) times daily. 16 tablet 0  . mometasone-formoterol (DULERA) 100-5 MCG/ACT AERO Inhale into the lungs.    . predniSONE (DELTASONE) 10 MG tablet Take 6 tablets on day 1, take 5 tablets on day 2, take 4 tablets on day 3, take 3 tablets on  day 4, take 2 tablets on day 5, take 1 tablet on day 6 21 tablet 0   No current facility-administered medications on file prior to visit.     PAST SURGICAL HISTORY No past surgical history on file.  FAMILY HISTORY: Family History  Problem Relation Age of Onset  . Hypertension Mother   . Cancer Maternal Grandmother   . Cancer Paternal Grandfather     SOCIAL HISTORY:  reports that she has never smoked. She has never used smokeless tobacco. She reports that she does not drink alcohol or use drugs.  PERFORMANCE STATUS: The patient's performance status is 1 - Symptomatic but completely ambulatory  PHYSICAL EXAM: Most Recent Vital Signs: There were no vitals  taken for this visit. There were no vitals taken for this visit.  General Appearance:    Alert, cooperative, no distress, appears stated age  Head:    Normocephalic, without obvious abnormality, atraumatic  Eyes:    PERRL, conjunctiva/corneas clear, EOM's intact, fundi    benign, both eyes        Throat:   Lips, mucosa, and tongue normal; teeth and gums normal  Neck:   Supple, symmetrical, trachea midline, no adenopathy;    thyroid:  no enlargement/tenderness/nodules; no carotid   bruit or JVD  Back:     Symmetric, no curvature, ROM normal, no CVA tenderness  Lungs:     Clear to auscultation bilaterally, respirations unlabored  Chest Wall:    No tenderness or deformity   Heart:    Regular rate and rhythm, S1 and S2 normal, no murmur, rub   or gallop     Abdomen:     Soft, non-tender, bowel sounds active all four quadrants,    no masses, no organomegaly        Extremities:   Extremities normal, atraumatic, no cyanosis or edema  Pulses:   2+ and symmetric all extremities  Skin:   Skin color, texture, turgor normal, no rashes or lesions  Lymph nodes:   Cervical, supraclavicular, and axillary nodes normal  Neurologic:   CNII-XII intact, normal strength, sensation and reflexes    throughout    LABORATORY DATA:  No results found for this or any previous visit (from the past 48 hour(s)).    RADIOGRAPHY: No results found.     PATHOLOGY: None  ASSESSMENT/PLAN: Patricia Bishop is a very pleasant 44 yo African American female with a long history of anemia secondary to heavy cycles (uterine fibroids).  She is symptomatic as mentioned above.  She is still considering whether she wants to have the ablation/embolization or hysterectomy.  We will see what her iron studies look like and set her up for replacement over the next 2 weeks.  We will plan to see her again in another 6 week.   All questions were answered an she is in agreement. She will contact our office with any questions or  concerns. We can certainly see her sooner if needed.   She was discussed with and also seen by Dr. Marin Olp and he is in agreement with the aforementioned.   Laverna Peace, NP    Addendum: I saw and examined the patient with Dyamon Bishop.  I agree with the above assessment on Patricia Bishop.  It is obvious she has iron deficiency anemia.  Her iron studies show a ferritin less than 4 with an iron saturation of 3%.  Clearly, she has heavy monthly cycles.  She definitely is in need of some type of therapy to help with  her monthly cycles.  I did look at her blood smear under the microscope.  She definitely had some mild anisocytosis and poikilocytosis.  There was microcytic red blood cells.  There were some ghost cells.  There were no nucleated red blood cells.  She had normal white blood cell morphology.  There were no hypersegmented polys.  Platelets looked fine.  She clearly will need IV iron.  Thankfully, she is young so she would not need to be transfused.  I do not see any other etiology for the iron deficiency outside of her monthly cycles.    We will set her up with IV iron.  We will then plan to try to get her back in about a month or so.  I am not sure when she has to see her gynecologist to talk about options to help control her heavy cycles.  We spent about 45 minutes with Patricia Bishop.  She is very nice.  I definitely enjoyed talking with her.  Lattie Haw, MD

## 2019-08-31 NOTE — Telephone Encounter (Signed)
Called and spoke with patient regarding appointments added per 4/27 los.  She was ok with both date/time

## 2019-09-01 LAB — ERYTHROPOIETIN: Erythropoietin: 125.2 m[IU]/mL — ABNORMAL HIGH (ref 2.6–18.5)

## 2019-09-02 ENCOUNTER — Telehealth: Payer: Self-pay | Admitting: Family

## 2019-09-02 LAB — HGB FRACTIONATION CASCADE
Hgb A2: 1.9 % (ref 1.8–3.2)
Hgb A: 98.1 % (ref 96.4–98.8)
Hgb F: 0 % (ref 0.0–2.0)
Hgb S: 0 %

## 2019-09-02 NOTE — Telephone Encounter (Signed)
Called and LMVM for patient regarding appointments scheduled per 4/27 sch msg

## 2019-09-03 ENCOUNTER — Inpatient Hospital Stay: Payer: BC Managed Care – PPO

## 2019-09-03 ENCOUNTER — Ambulatory Visit: Payer: BC Managed Care – PPO | Admitting: Family

## 2019-09-06 ENCOUNTER — Other Ambulatory Visit: Payer: Self-pay

## 2019-09-06 ENCOUNTER — Inpatient Hospital Stay: Payer: BC Managed Care – PPO | Attending: Family

## 2019-09-06 VITALS — BP 131/77 | HR 77 | Temp 96.9°F | Resp 17

## 2019-09-06 DIAGNOSIS — D500 Iron deficiency anemia secondary to blood loss (chronic): Secondary | ICD-10-CM | POA: Insufficient documentation

## 2019-09-06 DIAGNOSIS — Z79899 Other long term (current) drug therapy: Secondary | ICD-10-CM | POA: Diagnosis not present

## 2019-09-06 DIAGNOSIS — Z8041 Family history of malignant neoplasm of ovary: Secondary | ICD-10-CM | POA: Diagnosis not present

## 2019-09-06 DIAGNOSIS — Z8249 Family history of ischemic heart disease and other diseases of the circulatory system: Secondary | ICD-10-CM | POA: Insufficient documentation

## 2019-09-06 DIAGNOSIS — N920 Excessive and frequent menstruation with regular cycle: Secondary | ICD-10-CM | POA: Insufficient documentation

## 2019-09-06 DIAGNOSIS — Z8042 Family history of malignant neoplasm of prostate: Secondary | ICD-10-CM | POA: Diagnosis not present

## 2019-09-06 MED ORDER — SODIUM CHLORIDE 0.9 % IV SOLN
200.0000 mg | Freq: Once | INTRAVENOUS | Status: AC
  Administered 2019-09-06: 200 mg via INTRAVENOUS
  Filled 2019-09-06: qty 200

## 2019-09-06 MED ORDER — SODIUM CHLORIDE 0.9 % IV SOLN
Freq: Once | INTRAVENOUS | Status: AC
  Filled 2019-09-06: qty 250

## 2019-09-06 NOTE — Patient Instructions (Signed)

## 2019-09-08 LAB — ALPHA-THALASSEMIA GENOTYPR

## 2019-09-10 ENCOUNTER — Other Ambulatory Visit: Payer: Self-pay

## 2019-09-10 ENCOUNTER — Inpatient Hospital Stay: Payer: BC Managed Care – PPO

## 2019-09-10 VITALS — BP 138/88 | HR 74 | Temp 97.1°F | Resp 17

## 2019-09-10 DIAGNOSIS — N920 Excessive and frequent menstruation with regular cycle: Secondary | ICD-10-CM | POA: Diagnosis not present

## 2019-09-10 DIAGNOSIS — Z8041 Family history of malignant neoplasm of ovary: Secondary | ICD-10-CM | POA: Diagnosis not present

## 2019-09-10 DIAGNOSIS — Z79899 Other long term (current) drug therapy: Secondary | ICD-10-CM | POA: Diagnosis not present

## 2019-09-10 DIAGNOSIS — Z8042 Family history of malignant neoplasm of prostate: Secondary | ICD-10-CM | POA: Diagnosis not present

## 2019-09-10 DIAGNOSIS — Z8249 Family history of ischemic heart disease and other diseases of the circulatory system: Secondary | ICD-10-CM | POA: Diagnosis not present

## 2019-09-10 DIAGNOSIS — D500 Iron deficiency anemia secondary to blood loss (chronic): Secondary | ICD-10-CM

## 2019-09-10 MED ORDER — SODIUM CHLORIDE 0.9 % IV SOLN
Freq: Once | INTRAVENOUS | Status: AC
  Filled 2019-09-10: qty 250

## 2019-09-10 MED ORDER — SODIUM CHLORIDE 0.9 % IV SOLN
200.0000 mg | Freq: Once | INTRAVENOUS | Status: AC
  Administered 2019-09-10: 200 mg via INTRAVENOUS
  Filled 2019-09-10: qty 200

## 2019-09-10 NOTE — Patient Instructions (Signed)

## 2019-09-13 ENCOUNTER — Other Ambulatory Visit: Payer: Self-pay

## 2019-09-13 ENCOUNTER — Inpatient Hospital Stay: Payer: BC Managed Care – PPO

## 2019-09-13 VITALS — BP 143/87 | HR 85 | Temp 97.3°F | Resp 17

## 2019-09-13 DIAGNOSIS — Z8249 Family history of ischemic heart disease and other diseases of the circulatory system: Secondary | ICD-10-CM | POA: Diagnosis not present

## 2019-09-13 DIAGNOSIS — Z8041 Family history of malignant neoplasm of ovary: Secondary | ICD-10-CM | POA: Diagnosis not present

## 2019-09-13 DIAGNOSIS — Z79899 Other long term (current) drug therapy: Secondary | ICD-10-CM | POA: Diagnosis not present

## 2019-09-13 DIAGNOSIS — D500 Iron deficiency anemia secondary to blood loss (chronic): Secondary | ICD-10-CM

## 2019-09-13 DIAGNOSIS — N920 Excessive and frequent menstruation with regular cycle: Secondary | ICD-10-CM | POA: Diagnosis not present

## 2019-09-13 DIAGNOSIS — Z8042 Family history of malignant neoplasm of prostate: Secondary | ICD-10-CM | POA: Diagnosis not present

## 2019-09-13 MED ORDER — SODIUM CHLORIDE 0.9 % IV SOLN
Freq: Once | INTRAVENOUS | Status: AC
  Filled 2019-09-13: qty 250

## 2019-09-13 MED ORDER — SODIUM CHLORIDE 0.9 % IV SOLN
200.0000 mg | Freq: Once | INTRAVENOUS | Status: AC
  Administered 2019-09-13: 200 mg via INTRAVENOUS
  Filled 2019-09-13: qty 200

## 2019-09-13 NOTE — Progress Notes (Signed)
Pt declined to stay for 30 minute post infusion observation period. Pt stated she has tolerated procedure prior. Pt aware to call clinic with any questions or concerns. Pt verbalized understanding and had no further questions.

## 2019-09-13 NOTE — Patient Instructions (Signed)

## 2019-09-17 ENCOUNTER — Inpatient Hospital Stay: Payer: BC Managed Care – PPO

## 2019-09-17 ENCOUNTER — Other Ambulatory Visit: Payer: Self-pay

## 2019-09-17 VITALS — BP 135/76 | HR 89 | Temp 97.3°F | Resp 17

## 2019-09-17 DIAGNOSIS — Z8249 Family history of ischemic heart disease and other diseases of the circulatory system: Secondary | ICD-10-CM | POA: Diagnosis not present

## 2019-09-17 DIAGNOSIS — Z8041 Family history of malignant neoplasm of ovary: Secondary | ICD-10-CM | POA: Diagnosis not present

## 2019-09-17 DIAGNOSIS — Z8042 Family history of malignant neoplasm of prostate: Secondary | ICD-10-CM | POA: Diagnosis not present

## 2019-09-17 DIAGNOSIS — N920 Excessive and frequent menstruation with regular cycle: Secondary | ICD-10-CM | POA: Diagnosis not present

## 2019-09-17 DIAGNOSIS — D500 Iron deficiency anemia secondary to blood loss (chronic): Secondary | ICD-10-CM

## 2019-09-17 DIAGNOSIS — Z79899 Other long term (current) drug therapy: Secondary | ICD-10-CM | POA: Diagnosis not present

## 2019-09-17 MED ORDER — SODIUM CHLORIDE 0.9 % IV SOLN
200.0000 mg | Freq: Once | INTRAVENOUS | Status: AC
  Administered 2019-09-17: 200 mg via INTRAVENOUS
  Filled 2019-09-17: qty 200

## 2019-09-17 MED ORDER — SODIUM CHLORIDE 0.9 % IV SOLN
Freq: Once | INTRAVENOUS | Status: AC
  Filled 2019-09-17: qty 250

## 2019-09-17 NOTE — Progress Notes (Signed)
Pt declined to stay for 30 minute post infusion observation period. Pt stated she has tolerated medication prior with no incidents. Pt aware to call clinic with any questions or concerns. Pt verbalized understanding and had no further questions.

## 2019-09-17 NOTE — Patient Instructions (Signed)

## 2019-09-18 ENCOUNTER — Ambulatory Visit: Payer: BC Managed Care – PPO | Attending: Internal Medicine

## 2019-09-18 DIAGNOSIS — Z23 Encounter for immunization: Secondary | ICD-10-CM

## 2019-09-18 NOTE — Progress Notes (Signed)
   Covid-19 Vaccination Clinic  Name:  Patricia Bishop    MRN: [id number] DOB: 01/17/1976  09/18/2019  [name] was observed post Covid-19 immunization for 15 minutes without incident. She was provided with Vaccine Information Sheet and instruction to access the V-Safe system.   [name] was instructed to call 911 with any severe reactions post vaccine: Marland Kitchen Difficulty breathing  . Swelling of face and throat  . A fast heartbeat  . A bad rash all over body  . Dizziness and weakness   Immunizations Administered    Name Date Dose VIS Date Route   Pfizer COVID-19 Vaccine 09/18/2019 10:09 AM 0.3 mL 06/30/2018 Intramuscular   Manufacturer: [employer], [employer]   Lot: [id number]   [place]: [id number]

## 2019-09-20 ENCOUNTER — Inpatient Hospital Stay: Payer: BC Managed Care – PPO

## 2019-09-20 ENCOUNTER — Other Ambulatory Visit: Payer: Self-pay

## 2019-09-20 VITALS — BP 129/83 | HR 71 | Temp 97.0°F | Resp 16

## 2019-09-20 DIAGNOSIS — D500 Iron deficiency anemia secondary to blood loss (chronic): Secondary | ICD-10-CM

## 2019-09-20 DIAGNOSIS — Z8042 Family history of malignant neoplasm of prostate: Secondary | ICD-10-CM | POA: Diagnosis not present

## 2019-09-20 DIAGNOSIS — Z8249 Family history of ischemic heart disease and other diseases of the circulatory system: Secondary | ICD-10-CM | POA: Diagnosis not present

## 2019-09-20 DIAGNOSIS — Z8041 Family history of malignant neoplasm of ovary: Secondary | ICD-10-CM | POA: Diagnosis not present

## 2019-09-20 DIAGNOSIS — N920 Excessive and frequent menstruation with regular cycle: Secondary | ICD-10-CM | POA: Diagnosis not present

## 2019-09-20 DIAGNOSIS — Z79899 Other long term (current) drug therapy: Secondary | ICD-10-CM | POA: Diagnosis not present

## 2019-09-20 MED ORDER — SODIUM CHLORIDE 0.9 % IV SOLN
Freq: Once | INTRAVENOUS | Status: AC
  Filled 2019-09-20: qty 250

## 2019-09-20 MED ORDER — SODIUM CHLORIDE 0.9 % IV SOLN
200.0000 mg | Freq: Once | INTRAVENOUS | Status: AC
  Administered 2019-09-20: 200 mg via INTRAVENOUS
  Filled 2019-09-20: qty 10

## 2019-09-20 NOTE — Patient Instructions (Signed)

## 2019-09-24 DIAGNOSIS — G43909 Migraine, unspecified, not intractable, without status migrainosus: Secondary | ICD-10-CM | POA: Diagnosis not present

## 2019-09-24 DIAGNOSIS — R030 Elevated blood-pressure reading, without diagnosis of hypertension: Secondary | ICD-10-CM | POA: Diagnosis not present

## 2019-09-24 DIAGNOSIS — J4530 Mild persistent asthma, uncomplicated: Secondary | ICD-10-CM | POA: Diagnosis not present

## 2019-10-11 ENCOUNTER — Ambulatory Visit: Payer: BC Managed Care – PPO

## 2019-10-12 ENCOUNTER — Encounter: Payer: Self-pay | Admitting: Family

## 2019-10-12 ENCOUNTER — Other Ambulatory Visit: Payer: Self-pay

## 2019-10-12 ENCOUNTER — Inpatient Hospital Stay (HOSPITAL_BASED_OUTPATIENT_CLINIC_OR_DEPARTMENT_OTHER): Payer: BC Managed Care – PPO | Admitting: Family

## 2019-10-12 ENCOUNTER — Inpatient Hospital Stay: Payer: BC Managed Care – PPO | Attending: Family

## 2019-10-12 VITALS — BP 142/93 | HR 77 | Temp 97.9°F | Resp 18 | Ht 64.0 in | Wt 207.7 lb

## 2019-10-12 DIAGNOSIS — Z86018 Personal history of other benign neoplasm: Secondary | ICD-10-CM | POA: Insufficient documentation

## 2019-10-12 DIAGNOSIS — D259 Leiomyoma of uterus, unspecified: Secondary | ICD-10-CM | POA: Insufficient documentation

## 2019-10-12 DIAGNOSIS — D500 Iron deficiency anemia secondary to blood loss (chronic): Secondary | ICD-10-CM

## 2019-10-12 DIAGNOSIS — Z79899 Other long term (current) drug therapy: Secondary | ICD-10-CM | POA: Diagnosis not present

## 2019-10-12 DIAGNOSIS — N920 Excessive and frequent menstruation with regular cycle: Secondary | ICD-10-CM | POA: Insufficient documentation

## 2019-10-12 LAB — CBC WITH DIFFERENTIAL (CANCER CENTER ONLY)
Abs Immature Granulocytes: 0.02 10*3/uL (ref 0.00–0.07)
Basophils Absolute: 0.1 10*3/uL (ref 0.0–0.1)
Basophils Relative: 1 %
Eosinophils Absolute: 0.2 10*3/uL (ref 0.0–0.5)
Eosinophils Relative: 3 %
HCT: 39.2 % (ref 36.0–46.0)
Hemoglobin: 11.5 g/dL — ABNORMAL LOW (ref 12.0–15.0)
Immature Granulocytes: 0 %
Lymphocytes Relative: 32 %
Lymphs Abs: 1.6 10*3/uL (ref 0.7–4.0)
MCH: 21.9 pg — ABNORMAL LOW (ref 26.0–34.0)
MCHC: 29.3 g/dL — ABNORMAL LOW (ref 30.0–36.0)
MCV: 74.5 fL — ABNORMAL LOW (ref 80.0–100.0)
Monocytes Absolute: 0.4 10*3/uL (ref 0.1–1.0)
Monocytes Relative: 9 %
Neutro Abs: 2.7 10*3/uL (ref 1.7–7.7)
Neutrophils Relative %: 55 %
Platelet Count: 312 10*3/uL (ref 150–400)
RBC: 5.26 MIL/uL — ABNORMAL HIGH (ref 3.87–5.11)
WBC Count: 5 10*3/uL (ref 4.0–10.5)
nRBC: 0 % (ref 0.0–0.2)

## 2019-10-12 LAB — RETICULOCYTES
Immature Retic Fract: 16.6 % — ABNORMAL HIGH (ref 2.3–15.9)
RBC.: 5.29 MIL/uL — ABNORMAL HIGH (ref 3.87–5.11)
Retic Count, Absolute: 64 10*3/uL (ref 19.0–186.0)
Retic Ct Pct: 1.2 % (ref 0.4–3.1)

## 2019-10-12 LAB — IRON AND TIBC
Iron: 42 ug/dL (ref 41–142)
Saturation Ratios: 11 % — ABNORMAL LOW (ref 21–57)
TIBC: 395 ug/dL (ref 236–444)
UIBC: 353 ug/dL (ref 120–384)

## 2019-10-12 LAB — FERRITIN: Ferritin: 42 ng/mL (ref 11–307)

## 2019-10-12 NOTE — Progress Notes (Signed)
Hematology and Oncology Follow Up Visit  Patricia Bishop [id number] 1976-01-18 44 y.o. 10/12/2019   Principle Diagnosis:  Iron deficiency anemia secondary to heavy cycles  Current Therapy:   IV iron as indicated    Interim History:  [name] is here today for follow-up. She is doing much better and has not had a cycle since we last saw her. She states that her cycles are heavy and irregular.  She has discussed several options with her gynecologist including ablation but has not made a decision yet.  No other bleeding noted. No bruising or petechiae.  Hgb is up to 11.5, MCV 74, platelets 312.  No fever, chills, n/v, cough, rash, dizziness, SOB, chest pain, palpitations, abdominal pain or changes in bowel or bladder habits.  No tenderness, numbness or tingling in her extremities at this time.  She has puffiness in her feet and ankles that comes and goes. Pedal pulses are 2+.  No falls or syncopal episodes to report.  She has maintained a good appetite and is staying well hydrated. Her weight is stable.   ECOG Performance Status: 1 - Symptomatic but completely ambulatory  Medications:  Allergies as of 10/12/2019      Reactions   Other Other (See Comments)   TREE NUTS. No almonds, pecans. LARYNGEAL EDEMA. Peanuts are OK.      Medication List       Accurate as of October 12, 2019  8:44 AM. If you have any questions, ask your nurse or doctor.        ferrous sulfate 325 (65 FE) MG tablet Take 325 mg by mouth 3 (three) times daily with meals. What changed: Another medication with the same name was removed. Continue taking this medication, and follow the directions you see here. Changed by: Laverna Peace, NP   ibuprofen 600 MG tablet Commonly known as: ADVIL Take 1 tablet (600 mg total) by mouth every 6 (six) hours as needed.   mometasone-formoterol 100-5 MCG/ACT Aero Commonly known as: DULERA Inhale into the lungs.   ProAir HFA 108 (90 Base) MCG/ACT inhaler Generic drug:  albuterol Inhale into the lungs.       Allergies:  Allergies  Allergen Reactions  . Other Other (See Comments)    TREE NUTS. No almonds, pecans. LARYNGEAL EDEMA. Peanuts are OK.    Past Medical History, Surgical history, Social history, and Family History were reviewed and updated.  Review of Systems: All other 10 point review of systems is negative.   Physical Exam:  vitals were not taken for this visit.   Wt Readings from Last 3 Encounters:  08/31/19 205 lb 12.8 oz (93.4 kg)  05/03/18 198 lb (89.8 kg)  03/14/18 196 lb 3.4 oz (89 kg)    Ocular: Sclerae unicteric, pupils equal, round and reactive to light Ear-nose-throat: Oropharynx clear, dentition fair Lymphatic: No cervical or supraclavicular adenopathy Lungs no rales or rhonchi, good excursion bilaterally Heart regular rate and rhythm, no murmur appreciated Abd soft, nontender, positive bowel sounds, no liver or spleen tip palpated on exam, no fluid wave  MSK no focal spinal tenderness, no joint edema Neuro: non-focal, well-oriented, appropriate affect Breasts: Deferred   Lab Results  Component Value Date   WBC 5.0 10/12/2019   HGB 11.5 (L) 10/12/2019   HCT 39.2 10/12/2019   MCV 74.5 (L) 10/12/2019   PLT 312 10/12/2019   Lab Results  Component Value Date   FERRITIN <4 (L) 08/31/2019   IRON 16 (L) 08/31/2019   TIBC 470 (  H) 08/31/2019   UIBC 454 (H) 08/31/2019   IRONPCTSAT 3 (L) 08/31/2019   Lab Results  Component Value Date   RETICCTPCT 1.2 10/12/2019   RBC 5.26 (H) 10/12/2019   No results found for: KPAFRELGTCHN, LAMBDASER, KAPLAMBRATIO No results found for: IGGSERUM, IGA, IGMSERUM No results found for: Ronnald Ramp, A1GS, A2GS, Tillman Sers, SPEI   Chemistry      Component Value Date/Time   NA 138 08/31/2019 0822   K 3.8 08/31/2019 0822   CL 106 08/31/2019 0822   CO2 26 08/31/2019 0822   BUN 11 08/31/2019 0822   CREATININE 0.83 08/31/2019 0822      Component Value  Date/Time   CALCIUM 9.0 08/31/2019 0822   ALKPHOS 47 08/31/2019 0822   AST 10 (L) 08/31/2019 0822   ALT 11 08/31/2019 0822   BILITOT 0.3 08/31/2019 3335       Impression and Plan: [name] is a very pleasant 44 yo African American female with a long history of anemia secondary to heavy cycles (uterine fibroids).  She seems to have responded nicely to IV iron so far.  We will see what her counts look like and replace if needed.  We will plan to see her back in another 3 months.  She will contact our office with any questions or concerns. We cam certainly see her sooner if needed.   Laverna Peace, NP 6/8/20218:44 AM

## 2019-10-13 ENCOUNTER — Telehealth: Payer: Self-pay | Admitting: Hematology & Oncology

## 2019-10-13 NOTE — Telephone Encounter (Signed)
Called and LMVM for patient with appointments that have been added per 6/8 sch msg

## 2019-10-15 ENCOUNTER — Ambulatory Visit: Payer: BC Managed Care – PPO

## 2019-10-16 ENCOUNTER — Ambulatory Visit: Payer: BC Managed Care – PPO | Attending: Internal Medicine

## 2019-10-16 DIAGNOSIS — Z23 Encounter for immunization: Secondary | ICD-10-CM

## 2019-10-16 NOTE — Progress Notes (Signed)
   Covid-19 Vaccination Clinic  Name:  Patricia Bishop    MRN: [id number] DOB: Jul 18, 1975  10/16/2019  [name] was observed post Covid-19 immunization for 30 minutes based on pre-vaccination screening without incident. She was provided with Vaccine Information Sheet and instruction to access the V-Safe system.   [name] was instructed to call 911 with any severe reactions post vaccine: Marland Kitchen Difficulty breathing  . Swelling of face and throat  . A fast heartbeat  . A bad rash all over body  . Dizziness and weakness   Immunizations Administered    Name Date Dose VIS Date Route   Pfizer COVID-19 Vaccine 10/16/2019  9:49 AM 0.3 mL 06/30/2018 Intramuscular   Manufacturer: [employer], [employer]   Lot: [id number]   [place]: [id number]

## 2019-10-18 ENCOUNTER — Ambulatory Visit: Payer: BC Managed Care – PPO

## 2019-10-20 ENCOUNTER — Ambulatory Visit: Payer: BC Managed Care – PPO

## 2019-10-20 ENCOUNTER — Inpatient Hospital Stay: Payer: BC Managed Care – PPO

## 2019-10-20 ENCOUNTER — Other Ambulatory Visit: Payer: Self-pay

## 2019-10-20 VITALS — BP 121/84 | HR 83 | Temp 98.7°F | Resp 17

## 2019-10-20 DIAGNOSIS — D500 Iron deficiency anemia secondary to blood loss (chronic): Secondary | ICD-10-CM | POA: Diagnosis not present

## 2019-10-20 DIAGNOSIS — N920 Excessive and frequent menstruation with regular cycle: Secondary | ICD-10-CM | POA: Diagnosis not present

## 2019-10-20 DIAGNOSIS — D259 Leiomyoma of uterus, unspecified: Secondary | ICD-10-CM | POA: Diagnosis not present

## 2019-10-20 DIAGNOSIS — Z86018 Personal history of other benign neoplasm: Secondary | ICD-10-CM | POA: Diagnosis not present

## 2019-10-20 DIAGNOSIS — Z79899 Other long term (current) drug therapy: Secondary | ICD-10-CM | POA: Diagnosis not present

## 2019-10-20 MED ORDER — SODIUM CHLORIDE 0.9 % IV SOLN
200.0000 mg | Freq: Once | INTRAVENOUS | Status: AC
  Administered 2019-10-20: 200 mg via INTRAVENOUS
  Filled 2019-10-20: qty 200

## 2019-10-20 MED ORDER — SODIUM CHLORIDE 0.9 % IV SOLN
Freq: Once | INTRAVENOUS | Status: AC
  Filled 2019-10-20: qty 250

## 2019-10-25 ENCOUNTER — Inpatient Hospital Stay: Payer: BC Managed Care – PPO

## 2019-10-25 ENCOUNTER — Other Ambulatory Visit: Payer: Self-pay

## 2019-10-25 VITALS — BP 132/79 | HR 76 | Temp 97.3°F | Resp 17

## 2019-10-25 DIAGNOSIS — D500 Iron deficiency anemia secondary to blood loss (chronic): Secondary | ICD-10-CM

## 2019-10-25 DIAGNOSIS — Z79899 Other long term (current) drug therapy: Secondary | ICD-10-CM | POA: Diagnosis not present

## 2019-10-25 DIAGNOSIS — Z86018 Personal history of other benign neoplasm: Secondary | ICD-10-CM | POA: Diagnosis not present

## 2019-10-25 DIAGNOSIS — N920 Excessive and frequent menstruation with regular cycle: Secondary | ICD-10-CM | POA: Diagnosis not present

## 2019-10-25 DIAGNOSIS — D259 Leiomyoma of uterus, unspecified: Secondary | ICD-10-CM | POA: Diagnosis not present

## 2019-10-25 MED ORDER — SODIUM CHLORIDE 0.9 % IV SOLN
INTRAVENOUS | Status: DC
  Filled 2019-10-25: qty 250

## 2019-10-25 MED ORDER — SODIUM CHLORIDE 0.9 % IV SOLN
200.0000 mg | Freq: Once | INTRAVENOUS | Status: AC
  Administered 2019-10-25: 200 mg via INTRAVENOUS
  Filled 2019-10-25: qty 200

## 2019-10-25 NOTE — Patient Instructions (Signed)

## 2019-10-25 NOTE — Progress Notes (Signed)
Pt declined to stay for post infusion observation period. Pt stated she has tolerated medication multiple times prior without difficulty. Pt aware to call clinic with any questions or concerns. Pt verbalized understanding and had no further questions.  ? ?

## 2019-10-29 ENCOUNTER — Inpatient Hospital Stay: Payer: BC Managed Care – PPO

## 2019-10-29 ENCOUNTER — Other Ambulatory Visit: Payer: Self-pay

## 2019-10-29 VITALS — BP 123/87 | HR 62 | Temp 97.5°F | Resp 17

## 2019-10-29 DIAGNOSIS — N920 Excessive and frequent menstruation with regular cycle: Secondary | ICD-10-CM | POA: Diagnosis not present

## 2019-10-29 DIAGNOSIS — Z79899 Other long term (current) drug therapy: Secondary | ICD-10-CM | POA: Diagnosis not present

## 2019-10-29 DIAGNOSIS — Z86018 Personal history of other benign neoplasm: Secondary | ICD-10-CM | POA: Diagnosis not present

## 2019-10-29 DIAGNOSIS — D500 Iron deficiency anemia secondary to blood loss (chronic): Secondary | ICD-10-CM | POA: Diagnosis not present

## 2019-10-29 DIAGNOSIS — D259 Leiomyoma of uterus, unspecified: Secondary | ICD-10-CM | POA: Diagnosis not present

## 2019-10-29 MED ORDER — SODIUM CHLORIDE 0.9 % IV SOLN
200.0000 mg | Freq: Once | INTRAVENOUS | Status: AC
  Administered 2019-10-29: 200 mg via INTRAVENOUS
  Filled 2019-10-29: qty 200

## 2019-10-29 NOTE — Patient Instructions (Signed)

## 2019-10-29 NOTE — Progress Notes (Signed)
Patient opted not to stay for 30 minutes after iv iron.

## 2019-12-25 DIAGNOSIS — R600 Localized edema: Secondary | ICD-10-CM | POA: Diagnosis not present

## 2019-12-25 DIAGNOSIS — R609 Edema, unspecified: Secondary | ICD-10-CM | POA: Diagnosis not present

## 2019-12-30 DIAGNOSIS — D259 Leiomyoma of uterus, unspecified: Secondary | ICD-10-CM | POA: Diagnosis not present

## 2019-12-30 DIAGNOSIS — R609 Edema, unspecified: Secondary | ICD-10-CM | POA: Diagnosis not present

## 2019-12-30 DIAGNOSIS — R140 Abdominal distension (gaseous): Secondary | ICD-10-CM | POA: Diagnosis not present

## 2019-12-30 DIAGNOSIS — K449 Diaphragmatic hernia without obstruction or gangrene: Secondary | ICD-10-CM | POA: Diagnosis not present

## 2020-01-12 ENCOUNTER — Inpatient Hospital Stay: Payer: BC Managed Care – PPO | Admitting: Family

## 2020-01-12 ENCOUNTER — Inpatient Hospital Stay: Payer: BC Managed Care – PPO | Attending: Family

## 2020-01-27 DIAGNOSIS — Z0000 Encounter for general adult medical examination without abnormal findings: Secondary | ICD-10-CM | POA: Diagnosis not present

## 2020-01-27 DIAGNOSIS — Z13220 Encounter for screening for lipoid disorders: Secondary | ICD-10-CM | POA: Diagnosis not present

## 2020-01-27 DIAGNOSIS — D649 Anemia, unspecified: Secondary | ICD-10-CM | POA: Diagnosis not present

## 2020-01-27 DIAGNOSIS — Z23 Encounter for immunization: Secondary | ICD-10-CM | POA: Diagnosis not present

## 2020-02-29 ENCOUNTER — Other Ambulatory Visit: Payer: Self-pay | Admitting: Obstetrics and Gynecology

## 2020-02-29 DIAGNOSIS — Z419 Encounter for procedure for purposes other than remedying health state, unspecified: Secondary | ICD-10-CM

## 2020-03-01 ENCOUNTER — Other Ambulatory Visit: Payer: Self-pay | Admitting: Obstetrics and Gynecology

## 2020-04-10 NOTE — H&P (Signed)
Patricia Bishop is a 44 y.o. female, P: 2-0-0-1, presents for hysterectomy because of menorrhagia (causing anemia) and uterine fibroids.  The patient has a history of  oligomenorrhea and has skipped up to 5 consecutive menstrual periods.  Over the past year however,  she has had regular 7 day menstrual periods that require the use of overnight pads along with menstrual panties (to prevent soiling her clothes).  This flow is accompanied by cramping that she rates as 10/10 that will decrease to 6/10 with Midol or Ibuprofen.  She denies any dyspareunia, changes and bowel or bladder function or vaginitis symptoms.  An endometrial biopsy in April 2021 returned benign findings with no hyperplasia or malignancy..  An ultrasound in 2020 revealed an anteverted uterus: ( 10.9 from fundus to external os) 8.57 x 8.47 x 7.19 cm, endometrium: 0.81 cm; #2 fibroids distorting the endometrium: anterior intramural-2.37 cm and posterior right sub-serosal-6.31 cm; right ovary-4.97 cm and left ovary-5.00 cm. The patient has tried oral contraceptives for her symptoms however, they caused her significant  emotional lability so were discontinued.  She was also prescribed Lysteda with good results however, she does not believe this is an optimal solution.  After reviewing both medical and surgical management options for menorrhagia and fibroids,  the patient has decided to proceed with definitive therapy in the form of hysterectomy.   Past Medical History  OB History: G: 2;  P: 2-0-0-1; SVB: 2001 and 2005 (largest infant: 7 lbs. 13 oz.)  GYN History: menarche: 44 YO;    LMP: 02/03/2020    Contraception: none;  Denies history of abnormal PAP smear.  Last PAP smear: 2018-normal  Medical History: Menorrhagia, PCOS, Migraine, Asthma, Thyromegaly and Anemia (receives iron infusions)  Surgical History: 2020 Bilateral Carpal Tunnel Repair Denies problems with anesthesia or history of blood transfusions  Family History: Hypertension;  Prostate Cancer, Breast Cancer and Liver Cancer  Social History:  Married and employed in Therapist, art;  Denies tobacco or alcohol use   Medications:  Dulera 100 mcg-5 mcg/Actuation 1 puff bid ProAir 90 mcg/Actuation 2 puffs prn every 4 hours Sumatriptan 50 mg tablet prn   Allergies  Allergen Reactions  . Other Other (See Comments)    TREE NUTS. No almonds, pecans. LARYNGEAL EDEMA. Peanuts are OK.    Denies sensitivity to peanuts (no problems with peanuts) , shellfish, soy, latex or adhesives.    ROS: Admits to glasses, shortness of breath with asthma flare, occasional migraine headaches and left foot edema (negative multiple work-ups).  She denies  vision changes, nasal congestion, dysphagia, tinnitus, dizziness, hoarseness, cough,  chest pain,  nausea, vomiting, diarrhea,constipation,  urinary frequency, urgency  dysuria, hematuria, vaginitis symptoms, pelvic pain, swelling of joints,easy bruising,  myalgias, arthralgias, skin rashes, unexplained weight loss and except as is mentioned in the history of present illness, patient's review of systems is otherwise negative.     Physical Exam  Bp: 132/78;      P: 84 bpm;   R:16;    Temperature: 98.7 degrees F orally;            Weight: 214 lbs.;   Height: 5' 3.5";        BMI:37.3  Neck: supple without masses or thyromegaly Lungs: clear to auscultation Heart: regular rate and rhythm Abdomen: soft, non-tender and no organomegaly Pelvic:EGBUS- wnl; vagina-normal rugae; uterus-upper limits of normal size, cervix without lesions or motion tenderness; adnexae-no tenderness or masses Extremities:  no clubbing, cyanosis or edema   Assesment: Menorrhagia  Fibroids                      Anemia   Disposition:  A discussion was held with patient regarding the indication for her procedure(s) along with the risks, which include but are not limited to: reaction to anesthesia, damage to adjacent organs, infection,  excessive bleeding and possible open abdominal incision..  The patient verbalized understanding of these risks and has consented to proceed with a Laparoscopically Assisted Vaginal Hysterectomy, Bilateral Salpingectomy, Possible Abdominal Hysterectomy and Cystoscopy at [facility] on April 17, 2020 @12 :30 p.m.   CSN# [id number]   Elick Aguilera J. Florene Glen, PA-C  for Dr. Franklyn Lor. Dillard

## 2020-04-12 ENCOUNTER — Other Ambulatory Visit: Payer: Self-pay | Admitting: Obstetrics and Gynecology

## 2020-04-13 ENCOUNTER — Encounter (HOSPITAL_COMMUNITY): Payer: Self-pay

## 2020-04-13 ENCOUNTER — Other Ambulatory Visit: Payer: Self-pay

## 2020-04-13 ENCOUNTER — Encounter (HOSPITAL_COMMUNITY)
Admission: RE | Admit: 2020-04-13 | Discharge: 2020-04-13 | Disposition: A | Discharge status: Home / Self Care | Payer: BC Managed Care – PPO | Source: Ambulatory Visit | Attending: Obstetrics and Gynecology | Admitting: Obstetrics and Gynecology

## 2020-04-13 ENCOUNTER — Other Ambulatory Visit (HOSPITAL_COMMUNITY)
Admission: RE | Admit: 2020-04-13 | Discharge: 2020-04-13 | Disposition: A | Discharge status: Home / Self Care | Payer: BC Managed Care – PPO | Source: Ambulatory Visit | Attending: Obstetrics and Gynecology | Admitting: Obstetrics and Gynecology

## 2020-04-13 DIAGNOSIS — Z01812 Encounter for preprocedural laboratory examination: Secondary | ICD-10-CM | POA: Insufficient documentation

## 2020-04-13 DIAGNOSIS — Z20822 Contact with and (suspected) exposure to covid-19: Secondary | ICD-10-CM | POA: Insufficient documentation

## 2020-04-13 HISTORY — DX: Headache, unspecified: R51.9

## 2020-04-13 LAB — CBC
HCT: 37.6 % (ref 36.0–46.0)
Hemoglobin: 11.4 g/dL — ABNORMAL LOW (ref 12.0–15.0)
MCH: 23.9 pg — ABNORMAL LOW (ref 26.0–34.0)
MCHC: 30.3 g/dL (ref 30.0–36.0)
MCV: 79 fL — ABNORMAL LOW (ref 80.0–100.0)
Platelets: 320 10*3/uL (ref 150–400)
RBC: 4.76 MIL/uL (ref 3.87–5.11)
RDW: 15.2 % (ref 11.5–15.5)
WBC: 4 10*3/uL (ref 4.0–10.5)
nRBC: 0 % (ref 0.0–0.2)

## 2020-04-13 LAB — SARS CORONAVIRUS 2 (TAT 6-24 HRS): SARS Coronavirus 2: NEGATIVE

## 2020-04-13 NOTE — Progress Notes (Signed)
Anesthesia Review:  PCP:  DR Brien Few  Cardiologist :Dr Belinda Block  Chest x-ray : EKG : Echo : Stress test: Cardiac Cath :  Activity level:  Sleep Study/ CPAP : no  Fasting Blood Sugar :      / Checks Blood Sugar -- times a day:   Blood Thinner/ Instructions /Last Dose: ASA / Instructions/ Last Dose :  Had some heart palpitations per pt related to anemia  Had  ahd heart monitor 02/15/2019 - pt reports no followup .   B/p elevated at preop of 152/104 .  Pt reports being elevated with iron infusioins.  Instructed pt to followup with DR Lorelee Market today regarding elevation in blood pressure.

## 2020-04-13 NOTE — Progress Notes (Signed)
Patricia Bishop  04/13/2020      Your procedure is scheduled on  04/17/2020   Report to [facility]  at   [place].M.  Call this number if you have problems the morning of surgery:[phone]  OUR ADDRESS IS [address], WE ARE [facility]ATED [facility] WITH [facility].   Remember:  Do not eat food  after midnight. May have clear liquids from 12 midnite until 0945am morning of surgery.   Take these medicines the morning of surgery with A SIP OF WATER  Inhalers as usual and bring, Claritin    Do not wear jewelry, make-up or nail polish.  Do not wear lotions, powders, or perfumes, or deoderant.  Do not shave 48 hours prior to surgery.  Men may shave face and neck.  Do not bring valuables to the hospital.  [facility] is not responsible for any belongings or valuables.  Contacts, dentures or bridgework may not be worn into surgery.  Leave your suitcase in the car.  After surgery it may be brought to your room.  For patients admitted to the hospital, discharge time will be determined by your treatment team.  Patients discharged the day of surgery will not be allowed to drive home.      CLEAR LIQUID DIET   Foods Allowed                                                                     Foods Excluded  Coffee and tea, regular and decaf                             liquids that you cannot  Plain Jell-O any favor except red or purple                                           see through such as: Fruit ices (not with fruit pulp)                                     milk, soups, orange juice  Iced Popsicles                                    All solid food Carbonated beverages, regular and diet                                    Cranberry, grape and apple juices Sports drinks like Gatorade Lightly seasoned clear broth or consume(fat free) Sugar, honey syrup  Sample Menu Breakfast                                Lunch  Supper Cranberry juice                    Beef broth                            Chicken broth Jell-O                                     Grape juice                           Apple juice Coffee or tea                        Jell-O                                      Popsicle                                                Coffee or tea                        Coffee or tea  _____________________________________________________________________  [facility] - Preparing for Surgery Before surgery, you can play an important role.  Because skin is not sterile, your skin needs to be as free of germs as possible.  You can reduce the number of germs on your skin by washing with CHG (chlorahexidine gluconate) soap before surgery.  CHG is an antiseptic cleaner which kills germs and bonds with the skin to continue killing germs even after washing. Please DO NOT use if you have an allergy to CHG or antibacterial soaps.  If your skin becomes reddened/irritated stop using the CHG and inform your nurse when you arrive at Short Stay. Do not shave (including legs and underarms) for at least 48 hours prior to the first CHG shower.  You may shave your face/neck. Please follow these instructions carefully:  1.  Shower with CHG Soap the night before surgery and the  morning of Surgery.  2.  If you choose to wash your hair, wash your hair first as usual with your  normal  shampoo.  3.  After you shampoo, rinse your hair and body thoroughly to remove the  shampoo.                           4.  Use CHG as you would any other liquid soap.  You can apply chg directly  to the skin and wash                       Gently with a scrungie or clean washcloth.  5.  Apply the CHG Soap to your body ONLY FROM THE NECK DOWN.   Do not use on face/ open                           Wound or open sores. Avoid contact with eyes, ears mouth and genitals (private parts).  Wash face,  Genitals (private parts) with your  normal soap.             6.  Wash thoroughly, paying special attention to the area where your surgery  will be performed.  7.  Thoroughly rinse your body with warm water from the neck down.  8.  DO NOT shower/wash with your normal soap after using and rinsing off  the CHG Soap.                9.  Pat yourself dry with a clean towel.            10.  Wear clean pajamas.            11.  Place clean sheets on your bed the night of your first shower and do not  sleep with pets. Day of Surgery : Do not apply any lotions/deodorants the morning of surgery.  Please wear clean clothes to the hospital/surgery center.  FAILURE TO FOLLOW THESE INSTRUCTIONS MAY RESULT IN THE CANCELLATION OF YOUR SURGERY PATIENT SIGNATURE_________________________________  NURSE SIGNATURE__________________________________  ________________________________________________________________________

## 2020-04-17 ENCOUNTER — Ambulatory Visit (HOSPITAL_BASED_OUTPATIENT_CLINIC_OR_DEPARTMENT_OTHER): Payer: BC Managed Care – PPO | Admitting: Anesthesiology

## 2020-04-17 ENCOUNTER — Encounter (HOSPITAL_BASED_OUTPATIENT_CLINIC_OR_DEPARTMENT_OTHER): Payer: Self-pay | Admitting: Obstetrics and Gynecology

## 2020-04-17 ENCOUNTER — Ambulatory Visit (HOSPITAL_BASED_OUTPATIENT_CLINIC_OR_DEPARTMENT_OTHER)
Admission: RE | Admit: 2020-04-17 | Discharge: 2020-04-18 | Disposition: A | Discharge status: Home / Self Care | Payer: BC Managed Care – PPO | Attending: Obstetrics and Gynecology | Admitting: Obstetrics and Gynecology

## 2020-04-17 ENCOUNTER — Ambulatory Visit (HOSPITAL_BASED_OUTPATIENT_CLINIC_OR_DEPARTMENT_OTHER): Payer: BC Managed Care – PPO | Admitting: Physician Assistant

## 2020-04-17 ENCOUNTER — Encounter (HOSPITAL_BASED_OUTPATIENT_CLINIC_OR_DEPARTMENT_OTHER)
Admission: RE | Disposition: A | Discharge status: Home / Self Care | Payer: Self-pay | Source: Home / Self Care | Attending: Obstetrics and Gynecology

## 2020-04-17 DIAGNOSIS — N920 Excessive and frequent menstruation with regular cycle: Secondary | ICD-10-CM | POA: Diagnosis present

## 2020-04-17 DIAGNOSIS — D649 Anemia, unspecified: Secondary | ICD-10-CM | POA: Insufficient documentation

## 2020-04-17 DIAGNOSIS — J45909 Unspecified asthma, uncomplicated: Secondary | ICD-10-CM | POA: Diagnosis not present

## 2020-04-17 DIAGNOSIS — R030 Elevated blood-pressure reading, without diagnosis of hypertension: Secondary | ICD-10-CM | POA: Diagnosis not present

## 2020-04-17 DIAGNOSIS — J309 Allergic rhinitis, unspecified: Secondary | ICD-10-CM | POA: Diagnosis not present

## 2020-04-17 DIAGNOSIS — D259 Leiomyoma of uterus, unspecified: Secondary | ICD-10-CM | POA: Insufficient documentation

## 2020-04-17 DIAGNOSIS — Z7951 Long term (current) use of inhaled steroids: Secondary | ICD-10-CM | POA: Insufficient documentation

## 2020-04-17 DIAGNOSIS — D509 Iron deficiency anemia, unspecified: Secondary | ICD-10-CM | POA: Diagnosis not present

## 2020-04-17 DIAGNOSIS — D251 Intramural leiomyoma of uterus: Secondary | ICD-10-CM | POA: Diagnosis not present

## 2020-04-17 DIAGNOSIS — D219 Benign neoplasm of connective and other soft tissue, unspecified: Secondary | ICD-10-CM | POA: Diagnosis present

## 2020-04-17 DIAGNOSIS — Z91018 Allergy to other foods: Secondary | ICD-10-CM | POA: Diagnosis not present

## 2020-04-17 HISTORY — PX: LAPAROSCOPIC VAGINAL HYSTERECTOMY WITH SALPINGECTOMY: SHX6680

## 2020-04-17 HISTORY — PX: CYSTOSCOPY: SHX5120

## 2020-04-17 LAB — TYPE AND SCREEN
ABO/RH(D): O POS
Antibody Screen: NEGATIVE

## 2020-04-17 LAB — BASIC METABOLIC PANEL
Anion gap: 9 (ref 5–15)
BUN: 10 mg/dL (ref 6–20)
CO2: 24 mmol/L (ref 22–32)
Calcium: 8.7 mg/dL — ABNORMAL LOW (ref 8.9–10.3)
Chloride: 105 mmol/L (ref 98–111)
Creatinine, Ser: 0.81 mg/dL (ref 0.44–1.00)
GFR, Estimated: >60 mL/min (ref >60)
Glucose, Bld: 93 mg/dL (ref 70–99)
Potassium: 4 mmol/L (ref 3.5–5.1)
Sodium: 138 mmol/L (ref 135–145)

## 2020-04-17 LAB — POCT PREGNANCY, URINE: Preg Test, Ur: NEGATIVE

## 2020-04-17 LAB — ABO/RH: ABO/RH(D): O POS

## 2020-04-17 SURGERY — HYSTERECTOMY, VAGINAL, LAPAROSCOPY-ASSISTED, WITH SALPINGECTOMY
Anesthesia: General | Site: Bladder

## 2020-04-17 MED ORDER — OXYCODONE-ACETAMINOPHEN 5-325 MG PO TABS
ORAL_TABLET | ORAL | Status: AC
  Filled 2020-04-17: qty 2

## 2020-04-17 MED ORDER — SCOPOLAMINE 1 MG/3DAYS TD PT72
MEDICATED_PATCH | TRANSDERMAL | Status: AC
  Filled 2020-04-17: qty 1

## 2020-04-17 MED ORDER — ACETAMINOPHEN 500 MG PO TABS
1000.0000 mg | ORAL_TABLET | Freq: Once | ORAL | Status: AC
  Administered 2020-04-17: 11:00:00 1000 mg via ORAL

## 2020-04-17 MED ORDER — LACTATED RINGERS IV SOLN: INTRAVENOUS | Status: DC

## 2020-04-17 MED ORDER — PROMETHAZINE HCL 25 MG/ML IJ SOLN
6.2500 mg | INTRAMUSCULAR | Status: DC | PRN
Start: 2020-04-17 — End: 2020-04-18

## 2020-04-17 MED ORDER — PHENYLEPHRINE 40 MCG/ML (10ML) SYRINGE FOR IV PUSH (FOR BLOOD PRESSURE SUPPORT)
PREFILLED_SYRINGE | INTRAVENOUS | Status: AC
  Filled 2020-04-17: qty 10

## 2020-04-17 MED ORDER — ORAL CARE MOUTH RINSE: 15.0000 mL | Freq: Once | OROMUCOSAL | Status: DC

## 2020-04-17 MED ORDER — CEFAZOLIN SODIUM-DEXTROSE 2-4 GM/100ML-% IV SOLN
INTRAVENOUS | Status: AC
  Filled 2020-04-17: qty 100

## 2020-04-17 MED ORDER — VASOPRESSIN 2 UNITS/2 ML IV SOSY
INTRAVENOUS | Status: DC | PRN
  Administered 2020-04-17: 20 [IU]

## 2020-04-17 MED ORDER — PHENYLEPHRINE 40 MCG/ML (10ML) SYRINGE FOR IV PUSH (FOR BLOOD PRESSURE SUPPORT)
PREFILLED_SYRINGE | INTRAVENOUS | Status: DC | PRN
  Administered 2020-04-17: 80 ug via INTRAVENOUS

## 2020-04-17 MED ORDER — CEFAZOLIN SODIUM-DEXTROSE 2-4 GM/100ML-% IV SOLN
2.0000 g | INTRAVENOUS | Status: AC
  Administered 2020-04-17: 13:00:00 2 g via INTRAVENOUS

## 2020-04-17 MED ORDER — KETOROLAC TROMETHAMINE 30 MG/ML IJ SOLN
INTRAMUSCULAR | Status: AC
  Filled 2020-04-17: qty 1

## 2020-04-17 MED ORDER — ONDANSETRON HCL 4 MG PO TABS: 4.0000 mg | ORAL_TABLET | Freq: Four times a day (QID) | ORAL | Status: DC | PRN

## 2020-04-17 MED ORDER — MIDAZOLAM HCL 2 MG/2ML IJ SOLN
INTRAMUSCULAR | Status: DC | PRN
  Administered 2020-04-17: 2 mg via INTRAVENOUS

## 2020-04-17 MED ORDER — ONDANSETRON HCL 4 MG/2ML IJ SOLN
4.0000 mg | Freq: Four times a day (QID) | INTRAMUSCULAR | Status: DC | PRN
  Administered 2020-04-17: 21:00:00 4 mg via INTRAVENOUS

## 2020-04-17 MED ORDER — ONDANSETRON HCL 4 MG/2ML IJ SOLN
INTRAMUSCULAR | Status: DC | PRN
  Administered 2020-04-17: 4 mg via INTRAVENOUS

## 2020-04-17 MED ORDER — PROMETHAZINE HCL 25 MG/ML IJ SOLN
INTRAMUSCULAR | Status: AC
  Filled 2020-04-17: qty 1

## 2020-04-17 MED ORDER — FENTANYL CITRATE (PF) 100 MCG/2ML IJ SOLN
INTRAMUSCULAR | Status: DC | PRN
  Administered 2020-04-17 (×5): 50 ug via INTRAVENOUS

## 2020-04-17 MED ORDER — DEXAMETHASONE SODIUM PHOSPHATE 10 MG/ML IJ SOLN
INTRAMUSCULAR | Status: AC
  Filled 2020-04-17: qty 1

## 2020-04-17 MED ORDER — FLUORESCEIN SODIUM 10 % IV SOLN
INTRAVENOUS | Status: AC
  Filled 2020-04-17: qty 5

## 2020-04-17 MED ORDER — SIMETHICONE 80 MG PO CHEW: 80.0000 mg | CHEWABLE_TABLET | Freq: Four times a day (QID) | ORAL | Status: DC | PRN

## 2020-04-17 MED ORDER — ROCURONIUM BROMIDE 10 MG/ML (PF) SYRINGE
PREFILLED_SYRINGE | INTRAVENOUS | Status: AC
  Filled 2020-04-17: qty 10

## 2020-04-17 MED ORDER — OXYCODONE-ACETAMINOPHEN 5-325 MG PO TABS
ORAL_TABLET | ORAL | Status: AC
  Filled 2020-04-17: qty 1

## 2020-04-17 MED ORDER — SUGAMMADEX SODIUM 200 MG/2ML IV SOLN
INTRAVENOUS | Status: DC | PRN
  Administered 2020-04-17: 100 mg via INTRAVENOUS
  Administered 2020-04-17: 200 mg via INTRAVENOUS

## 2020-04-17 MED ORDER — MENTHOL 3 MG MT LOZG: 1.0000 | LOZENGE | OROMUCOSAL | Status: DC | PRN

## 2020-04-17 MED ORDER — POVIDONE-IODINE 10 % EX SWAB
2.0000 "application " | Freq: Once | CUTANEOUS | Status: AC
  Administered 2020-04-17: 2 via TOPICAL

## 2020-04-17 MED ORDER — LIDOCAINE HCL 2 % IJ SOLN
INTRAMUSCULAR | Status: AC
  Filled 2020-04-17: qty 20

## 2020-04-17 MED ORDER — CHLORHEXIDINE GLUCONATE 0.12 % MT SOLN: 15.0000 mL | Freq: Once | OROMUCOSAL | Status: DC

## 2020-04-17 MED ORDER — ONDANSETRON HCL 4 MG/2ML IJ SOLN
INTRAMUSCULAR | Status: AC
  Filled 2020-04-17: qty 2

## 2020-04-17 MED ORDER — KETOROLAC TROMETHAMINE 30 MG/ML IJ SOLN
INTRAMUSCULAR | Status: DC | PRN
  Administered 2020-04-17: 30 mg via INTRAVENOUS

## 2020-04-17 MED ORDER — LIDOCAINE 2% (20 MG/ML) 5 ML SYRINGE
INTRAMUSCULAR | Status: DC | PRN
  Administered 2020-04-17: 40 mg via INTRAVENOUS
  Administered 2020-04-17: 60 mg via INTRAVENOUS
  Administered 2020-04-17: 1.5 mg/kg/h via INTRAVENOUS

## 2020-04-17 MED ORDER — FENTANYL CITRATE (PF) 250 MCG/5ML IJ SOLN
INTRAMUSCULAR | Status: AC
  Filled 2020-04-17: qty 5

## 2020-04-17 MED ORDER — DEXAMETHASONE SODIUM PHOSPHATE 10 MG/ML IJ SOLN
INTRAMUSCULAR | Status: DC | PRN
  Administered 2020-04-17: 10 mg via INTRAVENOUS

## 2020-04-17 MED ORDER — BUPIVACAINE HCL (PF) 0.25 % IJ SOLN
INTRAMUSCULAR | Status: DC | PRN
  Administered 2020-04-17: 5 mL

## 2020-04-17 MED ORDER — SCOPOLAMINE 1 MG/3DAYS TD PT72
1.0000 | MEDICATED_PATCH | TRANSDERMAL | Status: DC
  Administered 2020-04-17: 11:00:00 1.5 mg via TRANSDERMAL

## 2020-04-17 MED ORDER — ALBUTEROL SULFATE HFA 108 (90 BASE) MCG/ACT IN AERS: 2.0000 | INHALATION_SPRAY | Freq: Four times a day (QID) | RESPIRATORY_TRACT | Status: DC | PRN

## 2020-04-17 MED ORDER — ACETAMINOPHEN 500 MG PO TABS
ORAL_TABLET | ORAL | Status: AC
  Filled 2020-04-17: qty 2

## 2020-04-17 MED ORDER — MIDAZOLAM HCL 2 MG/2ML IJ SOLN
INTRAMUSCULAR | Status: AC
  Filled 2020-04-17: qty 2

## 2020-04-17 MED ORDER — LIDOCAINE HCL (PF) 2 % IJ SOLN
INTRAMUSCULAR | Status: AC
  Filled 2020-04-17: qty 5

## 2020-04-17 MED ORDER — PROPOFOL 10 MG/ML IV BOLUS
INTRAVENOUS | Status: AC
  Filled 2020-04-17: qty 40

## 2020-04-17 MED ORDER — AMISULPRIDE (ANTIEMETIC) 5 MG/2ML IV SOLN: 10.0000 mg | Freq: Once | INTRAVENOUS | Status: DC | PRN

## 2020-04-17 MED ORDER — OXYCODONE HCL 5 MG PO TABS: 5.0000 mg | ORAL_TABLET | Freq: Once | ORAL | Status: DC | PRN

## 2020-04-17 MED ORDER — PROPOFOL 10 MG/ML IV BOLUS
INTRAVENOUS | Status: DC | PRN
  Administered 2020-04-17: 150 mg via INTRAVENOUS

## 2020-04-17 MED ORDER — OXYCODONE-ACETAMINOPHEN 5-325 MG PO TABS
1.0000 | ORAL_TABLET | Freq: Four times a day (QID) | ORAL | Status: DC | PRN
  Administered 2020-04-17 – 2020-04-18 (×3): 2 via ORAL

## 2020-04-17 MED ORDER — OXYCODONE HCL 5 MG/5ML PO SOLN
5.0000 mg | Freq: Once | ORAL | Status: DC | PRN
Start: 2020-04-17 — End: 2020-04-18

## 2020-04-17 MED ORDER — KETAMINE HCL 10 MG/ML IJ SOLN
INTRAMUSCULAR | Status: DC | PRN
  Administered 2020-04-17: 40 mg via INTRAVENOUS

## 2020-04-17 MED ORDER — HYDROMORPHONE HCL 1 MG/ML IJ SOLN
INTRAMUSCULAR | Status: AC
  Filled 2020-04-17: qty 1

## 2020-04-17 MED ORDER — SODIUM CHLORIDE 0.9 % IR SOLN
Status: DC | PRN
  Administered 2020-04-17: 3000 mL

## 2020-04-17 MED ORDER — PROMETHAZINE HCL 25 MG/ML IJ SOLN
25.0000 mg | Freq: Four times a day (QID) | INTRAMUSCULAR | Status: DC | PRN
  Administered 2020-04-17: 25 mg via INTRAVENOUS

## 2020-04-17 MED ORDER — FLUORESCEIN SODIUM 10 % IV SOLN
INTRAVENOUS | Status: DC | PRN
  Administered 2020-04-17: 1 mL via INTRAVENOUS

## 2020-04-17 MED ORDER — FENTANYL CITRATE (PF) 100 MCG/2ML IJ SOLN
25.0000 ug | INTRAMUSCULAR | Status: DC | PRN
Start: 2020-04-17 — End: 2020-04-18

## 2020-04-17 MED ORDER — SODIUM CHLORIDE FLUSH 0.9 % IV SOLN
INTRAVENOUS | Status: DC | PRN
  Administered 2020-04-17: 100 mL

## 2020-04-17 MED ORDER — DOCUSATE SODIUM 100 MG PO CAPS: 100.0000 mg | ORAL_CAPSULE | Freq: Two times a day (BID) | ORAL | Status: DC

## 2020-04-17 MED ORDER — KETOROLAC TROMETHAMINE 30 MG/ML IJ SOLN
30.0000 mg | Freq: Four times a day (QID) | INTRAMUSCULAR | Status: DC
  Administered 2020-04-17 – 2020-04-18 (×2): 30 mg via INTRAVENOUS

## 2020-04-17 MED ORDER — HYDROMORPHONE HCL 1 MG/ML IJ SOLN
1.0000 mg | INTRAMUSCULAR | Status: DC | PRN
  Administered 2020-04-17: 1 mg via INTRAVENOUS

## 2020-04-17 MED ORDER — ESTRADIOL 0.1 MG/GM VA CREA
TOPICAL_CREAM | VAGINAL | Status: DC | PRN
  Administered 2020-04-17: 1 via VAGINAL

## 2020-04-17 MED ORDER — ROCURONIUM BROMIDE 10 MG/ML (PF) SYRINGE
PREFILLED_SYRINGE | INTRAVENOUS | Status: DC | PRN
  Administered 2020-04-17 (×2): 10 mg via INTRAVENOUS
  Administered 2020-04-17: 70 mg via INTRAVENOUS

## 2020-04-17 MED ORDER — KETAMINE HCL 10 MG/ML IJ SOLN
INTRAMUSCULAR | Status: AC
  Filled 2020-04-17: qty 1

## 2020-04-17 SURGICAL SUPPLY — 82 items
APPLICATOR ARISTA FLEXITIP XL (MISCELLANEOUS) IMPLANT
CABLE HIGH FREQUENCY MONO STRZ (ELECTRODE) IMPLANT
CANISTER SUCT 3000ML PPV (MISCELLANEOUS) ×5 IMPLANT
CATH FOLEY 3WAY  5CC 16FR (CATHETERS)
CATH FOLEY 3WAY 5CC 16FR (CATHETERS) IMPLANT
CLOSURE WOUND 1/2 X4 (GAUZE/BANDAGES/DRESSINGS) ×1
COVER BACK TABLE 60X90IN (DRAPES) ×5 IMPLANT
COVER MAYO STAND STRL (DRAPES) ×5 IMPLANT
DECANTER SPIKE VIAL GLASS SM (MISCELLANEOUS) IMPLANT
DERMABOND ADVANCED (GAUZE/BANDAGES/DRESSINGS) ×2
DERMABOND ADVANCED .7 DNX12 (GAUZE/BANDAGES/DRESSINGS) ×3 IMPLANT
DISSECTOR ROUND CHERRY 3/8 STR (MISCELLANEOUS) IMPLANT
DRAPE WARM FLUID 44X44 (DRAPES) IMPLANT
DRSG OPSITE POSTOP 3X4 (GAUZE/BANDAGES/DRESSINGS) ×5 IMPLANT
DRSG OPSITE POSTOP 4X10 (GAUZE/BANDAGES/DRESSINGS) ×5 IMPLANT
DURAPREP 26ML APPLICATOR (WOUND CARE) ×5 IMPLANT
ELECT REM PT RETURN 9FT ADLT (ELECTROSURGICAL)
ELECTRODE REM PT RTRN 9FT ADLT (ELECTROSURGICAL) IMPLANT
FORCEPS CUTTING 33CM 5MM (CUTTING FORCEPS) ×5 IMPLANT
GAUZE 4X4 16PLY RFD (DISPOSABLE) IMPLANT
GAUZE PACKING 2X5 YD STRL (GAUZE/BANDAGES/DRESSINGS) IMPLANT
GAUZE VASELINE 3X9 (GAUZE/BANDAGES/DRESSINGS) IMPLANT
GLOVE BIO SURGEON STRL SZ 6.5 (GLOVE) ×12 IMPLANT
GLOVE BIO SURGEON STRL SZ7 (GLOVE) ×10 IMPLANT
GLOVE BIO SURGEONS STRL SZ 6.5 (GLOVE) ×3
GLOVE BIOGEL PI IND STRL 7.0 (GLOVE) ×21 IMPLANT
GLOVE BIOGEL PI IND STRL 7.5 (GLOVE) ×6 IMPLANT
GLOVE BIOGEL PI INDICATOR 7.0 (GLOVE) ×14
GLOVE BIOGEL PI INDICATOR 7.5 (GLOVE) ×4
GLOVE ECLIPSE 6.5 STRL STRAW (GLOVE) ×5 IMPLANT
GOWN STRL REUS W/ TWL LRG LVL3 (GOWN DISPOSABLE) ×15 IMPLANT
GOWN STRL REUS W/TWL LRG LVL3 (GOWN DISPOSABLE) ×25
HEMOSTAT ARISTA ABSORB 3G PWDR (HEMOSTASIS) IMPLANT
HIBICLENS CHG 4% 4OZ (MISCELLANEOUS) ×5 IMPLANT
IV NS 1000ML (IV SOLUTION) ×5
IV NS 1000ML BAXH (IV SOLUTION) ×3 IMPLANT
IV NS IRRIG 3000ML ARTHROMATIC (IV SOLUTION) ×5 IMPLANT
KIT TURNOVER CYSTO (KITS) ×5 IMPLANT
NEEDLE HYPO 22GX1.5 SAFETY (NEEDLE) IMPLANT
NEEDLE MAYO CATGUT SZ4 (NEEDLE) ×5 IMPLANT
NS IRRIG 1000ML POUR BTL (IV SOLUTION) ×5 IMPLANT
PACK ABDOMINAL GYN (CUSTOM PROCEDURE TRAY) ×5 IMPLANT
PACK LAVH (CUSTOM PROCEDURE TRAY) ×5 IMPLANT
PACK ROBOTIC GOWN (GOWN DISPOSABLE) ×5 IMPLANT
PACK TRENDGUARD 450 HYBRID PRO (MISCELLANEOUS) IMPLANT
PAD ARMBOARD 7.5X6 YLW CONV (MISCELLANEOUS) ×5 IMPLANT
PAD OB MATERNITY 4.3X12.25 (PERSONAL CARE ITEMS) ×5 IMPLANT
PROTECTOR NERVE ULNAR (MISCELLANEOUS) ×10 IMPLANT
SET IRRIG Y TYPE TUR BLADDER L (SET/KITS/TRAYS/PACK) ×5 IMPLANT
SET SUCTION IRRIG HYDROSURG (IRRIGATION / IRRIGATOR) IMPLANT
SET TUBE SMOKE EVAC HIGH FLOW (TUBING) ×5 IMPLANT
SHEARS HARMONIC ACE PLUS 36CM (ENDOMECHANICALS) IMPLANT
SHEET LAVH (DRAPES) ×5 IMPLANT
SOLUTION ELECTROLUBE (MISCELLANEOUS) IMPLANT
SPONGE LAP 18X18 RF (DISPOSABLE) ×10 IMPLANT
SPONGE SURGIFOAM ABS GEL 12-7 (HEMOSTASIS) ×5 IMPLANT
STAPLER VISISTAT 35W (STAPLE) IMPLANT
STRIP CLOSURE SKIN 1/2X4 (GAUZE/BANDAGES/DRESSINGS) ×4 IMPLANT
SUT CHROMIC 0 CT 1 (SUTURE) ×5 IMPLANT
SUT CHROMIC 0 UR 5 27 (SUTURE) IMPLANT
SUT MNCRL AB 3-0 PS2 27 (SUTURE) ×10 IMPLANT
SUT MON AB 3-0 SH 27 (SUTURE) ×5
SUT MON AB 3-0 SH27 (SUTURE) ×3 IMPLANT
SUT PDS AB 0 CT1 27 (SUTURE) IMPLANT
SUT PLAIN 2 0 XLH (SUTURE) ×5 IMPLANT
SUT VIC AB 0 CT1 18XCR BRD8 (SUTURE) ×9 IMPLANT
SUT VIC AB 0 CT1 27 (SUTURE) ×25
SUT VIC AB 0 CT1 27XBRD ANBCTR (SUTURE) ×15 IMPLANT
SUT VIC AB 0 CT1 36 (SUTURE) ×10 IMPLANT
SUT VIC AB 0 CT1 8-18 (SUTURE) ×15
SUT VIC AB 2-0 SH 27 (SUTURE) ×5
SUT VIC AB 2-0 SH 27XBRD (SUTURE) ×3 IMPLANT
SUT VICRYL 0 TIES 12 18 (SUTURE) ×5 IMPLANT
SUT VICRYL 0 UR6 27IN ABS (SUTURE) ×5 IMPLANT
SYR 50ML LL SCALE MARK (SYRINGE) IMPLANT
SYR BULB IRRIG 60ML STRL (SYRINGE) ×5 IMPLANT
SYR CONTROL 10ML LL (SYRINGE) IMPLANT
TRAY FOLEY W/BAG SLVR 14FR LF (SET/KITS/TRAYS/PACK) ×5 IMPLANT
TRENDGUARD 450 HYBRID PRO PACK (MISCELLANEOUS)
TROCAR BALLN 12MMX100 BLUNT (TROCAR) ×5 IMPLANT
TROCAR BLADELESS OPT 5 100 (ENDOMECHANICALS) ×10 IMPLANT
WARMER LAPAROSCOPE (MISCELLANEOUS) ×5 IMPLANT

## 2020-04-17 NOTE — Anesthesia Procedure Notes (Signed)
Procedure Name: Intubation Date/Time: 04/17/2020 12:57 PM Performed by: Mechele Claude, CRNA Pre-anesthesia Checklist: Patient identified, Emergency Drugs available, Suction available and Patient being monitored Patient Re-evaluated:Patient Re-evaluated prior to induction Oxygen Delivery Method: Circle system utilized Preoxygenation: Pre-oxygenation with 100% oxygen Induction Type: IV induction Ventilation: Mask ventilation without difficulty Laryngoscope Size: Mac and 3 Tube type: Oral Tube size: 7.0 mm Number of attempts: 1 Airway Equipment and Method: Stylet and Oral airway Placement Confirmation: ETT inserted through vocal cords under direct vision,  positive ETCO2 and breath sounds checked- equal and bilateral Secured at: 21 cm Tube secured with: Tape Dental Injury: Teeth and Oropharynx as per pre-operative assessment

## 2020-04-17 NOTE — Progress Notes (Signed)
Attempt made to ambulate patient in hallway, pt dizzy and nauseated upon door threshold. Back to bed, VSS, pt back to sleep easily. Will attempt walk later tonight.

## 2020-04-17 NOTE — Progress Notes (Signed)
Dr. Charlesetta Garibaldi called notified of pt vomiting and already had zofran.  Orders rec for phenergan 25 mg IV.

## 2020-04-17 NOTE — Anesthesia Preprocedure Evaluation (Addendum)
Anesthesia Evaluation  Patient identified by MRN, date of birth, ID band Patient awake    Reviewed: Allergy & Precautions, NPO status , Patient's Chart, lab work & pertinent test results  Airway Mallampati: I  TM Distance: >3 FB Neck ROM: Full    Dental no notable dental hx.    Pulmonary asthma ,    Pulmonary exam normal breath sounds clear to auscultation       Cardiovascular Exercise Tolerance: Good negative cardio ROS Normal cardiovascular exam Rhythm:Regular Rate:Normal     Neuro/Psych  Headaches,  Neuromuscular disease (carpal tunnel) negative psych ROS   GI/Hepatic negative GI ROS, Neg liver ROS,   Endo/Other  thyromegaly  Renal/GU negative Renal ROS  negative genitourinary   Musculoskeletal negative musculoskeletal ROS (+)   Abdominal (+) + obese,   Peds negative pediatric ROS (+)  Hematology  (+) anemia ,   Anesthesia Other Findings   Reproductive/Obstetrics negative OB ROS                            Anesthesia Physical Anesthesia Plan  ASA: II  Anesthesia Plan: General   Post-op Pain Management:    Induction: Intravenous  PONV Risk Score and Plan: 2 and Scopolamine patch - Pre-op, Midazolam, Dexamethasone, Ondansetron and Treatment may vary due to age or medical condition  Airway Management Planned: Oral ETT  Additional Equipment: None  Intra-op Plan:   Post-operative Plan: Extubation in OR  Informed Consent: I have reviewed the patients History and Physical, chart, labs and discussed the procedure including the risks, benefits and alternatives for the proposed anesthesia with the patient or authorized representative who has indicated his/her understanding and acceptance.     Dental advisory given  Plan Discussed with: CRNA and Anesthesiologist  Anesthesia Plan Comments:        Anesthesia Quick Evaluation

## 2020-04-17 NOTE — Op Note (Signed)
Pre op agnosis: menorrhagia.  Sx fibroids, anemia  Postop Diagnosis:same   Procedure: LAVH, B salpingectmy Cystoscopy  Anesthesia: General   Anesthesiologist:   Attending: Betsy Coder, MD   Assistant: Earnstine Regal PA  Findings: 10 week size uterus.  Bladder uterine adhesions  Pathology: uterus and cervix and bilateral tubes  Fluids: 1600 cccrystalloid  UOP: 400cc  EBL: 573UK  Complications:none  Procedure: The patient was taken to the operating room, placed under general anesthesia and prepped and draped in the normal sterile fashion. A Foley catheter was placed in the bladder. A weighted speculum and vaginal retractors were placed in the vagina. Tenaculum was placed on the anterior lip of the cervix.  A hulka manipulator was placed in the uterus. Attention was then turned to the abdomen. A 10 mm infraumbilical incision was made with the scalpel after 5 cc of 25% percent Marcaine was used for local anesthesia. The subcutaneous tissue was dissected and the fascia was incised with the knife. A purse string stitch was placed in the fascia and Hassan placed into the intra-abdominal cavity and anchored to the suture. Intraabdominal placement was confirmed with the laparoscope.  Two 5 mm trochars were placed in the right and left lower quadrants under direct visualization with the laparoscope.   .   Both round ligaments were cauterized and cut with the gyrus bipolar cautery as well and the bladder flap created with the gyrus and hydrodisection using the nigat and removed away from the uterus.    The left fallopian tube was cauterized and removed.    The left uterine ovarian ligament was then cauterized and cut.  The right fallopian tube was cauterized cut and removed.  The right utero-ovarian ligament was cauterized and cut with the tripolar cautery gyrus. Attention was then turned to the vagina.  A weighted speculum was placed in the posterior fourchette.  petrussin mixture was placed  circumferentially around the cervix.  With blunt and sharp dissection the cervix was dissected away from the bowel and bladder.  Both uterosacral ligaments were clamped, cut and suture ligated and held.  The anterior and posterior culdesac was entered sharply using metzenbaum scissors.  The cardinal ligaments and  The uterine arteries were clamped, cut and suture ligated bilaterally.    The uterus was then delivered.  The mcall suture was placed.   The vaginal cuff was closed with interrupted suture of 0 chromic.   the patient was given fluroscein.  Cystoscopy was performed and both ureters were seen to efflux indigo carmine without difficulty. The bladder had full integrity with no suture or laceration visualized.  The vagina was inspected and the cuff was noted to be intact.  Attention was then turned back to the abdomen after removing top pair of gloves. The abdomen was reinsufflated with CO2 gas.   The abdomen and pelvis was copiously irrigated.       hemostasis was noted.  gelfoam was placed along the cuff.    All trochars were removed under direct visualization using the laparoscope.  The umbilical fascia was reapproximated by tying the circumferential suture. The two 5 mm incisions were closed with 3-0 Monocryl via a subcuticular stitch.  All remaining skin incisions were closed with Dermabond and the 10 mm skin incisions were reinforced using Dermabond.  Sponge lap and needle counts were correct.  The patient tolerated the procedure well and was returned to the PACU in stable condition

## 2020-04-17 NOTE — Anesthesia Postprocedure Evaluation (Signed)
Anesthesia Post Note  Patient: Patricia Bishop  Procedure(s) Performed: LAPAROSCOPIC ASSISTED VAGINAL HYSTERECTOMY WITH  BILATERAL SALPINGECTOMY (Bilateral Abdomen) CYSTOSCOPY (N/A Bladder)     Patient location during evaluation: PACU Anesthesia Type: General Level of consciousness: awake Pain management: pain level controlled Vital Signs Assessment: post-procedure vital signs reviewed and stable Respiratory status: spontaneous breathing Cardiovascular status: stable Postop Assessment: no apparent nausea or vomiting Anesthetic complications: no   No complications documented.  Last Vitals:  Vitals:   04/17/20 1551 04/17/20 1600  BP: (!) 155/94 (!) 156/97  Pulse: 89 85  Resp: 17 18  Temp: 36.9 C   SpO2: 99% 100%    Last Pain:  Vitals:   04/17/20 1551  TempSrc:   PainSc: 0-No pain                 Ariya Bohannon

## 2020-04-17 NOTE — Transfer of Care (Signed)
Immediate Anesthesia Transfer of Care Note  Patient: LOUIZA MOOR  Procedure(s) Performed: Procedure(s) (LRB): LAPAROSCOPIC ASSISTED VAGINAL HYSTERECTOMY WITH  BILATERAL SALPINGECTOMY (Bilateral) CYSTOSCOPY (N/A)  Patient Location: PACU  Anesthesia Type: General  Level of Consciousness: drowsy, follows commands.  Airway & Oxygen Therapy: Patient Spontanous Breathing and Patient connected to face mask oxygen  Post-op Assessment: Report given to PACU RN and Post -op Vital signs reviewed and stable  Post vital signs: Reviewed and stable  Complications: No apparent anesthesia complicationsLast Vitals:  Vitals Value Taken Time  BP 155/94 04/17/20 1551  Temp    Pulse 85 04/17/20 1553  Resp 16 04/17/20 1553  SpO2 99 % 04/17/20 1553  Vitals shown include unvalidated device data.  Last Pain:  Vitals:   04/17/20 1056  TempSrc: Oral  PainSc: 3       Patients Stated Pain Goal: 3 (53/74/82 7078)  Complications: No complications documented.

## 2020-04-18 ENCOUNTER — Encounter (HOSPITAL_BASED_OUTPATIENT_CLINIC_OR_DEPARTMENT_OTHER): Payer: Self-pay | Admitting: Obstetrics and Gynecology

## 2020-04-18 DIAGNOSIS — D259 Leiomyoma of uterus, unspecified: Secondary | ICD-10-CM | POA: Diagnosis not present

## 2020-04-18 DIAGNOSIS — N920 Excessive and frequent menstruation with regular cycle: Secondary | ICD-10-CM | POA: Diagnosis not present

## 2020-04-18 DIAGNOSIS — D649 Anemia, unspecified: Secondary | ICD-10-CM | POA: Diagnosis not present

## 2020-04-18 DIAGNOSIS — Z7951 Long term (current) use of inhaled steroids: Secondary | ICD-10-CM | POA: Diagnosis not present

## 2020-04-18 DIAGNOSIS — Z91018 Allergy to other foods: Secondary | ICD-10-CM | POA: Diagnosis not present

## 2020-04-18 DIAGNOSIS — R030 Elevated blood-pressure reading, without diagnosis of hypertension: Secondary | ICD-10-CM | POA: Diagnosis not present

## 2020-04-18 LAB — CBC
HCT: 33.5 % — ABNORMAL LOW (ref 36.0–46.0)
Hemoglobin: 10.5 g/dL — ABNORMAL LOW (ref 12.0–15.0)
MCH: 24.3 pg — ABNORMAL LOW (ref 26.0–34.0)
MCHC: 31.3 g/dL (ref 30.0–36.0)
MCV: 77.5 fL — ABNORMAL LOW (ref 80.0–100.0)
Platelets: 386 10*3/uL (ref 150–400)
RBC: 4.32 MIL/uL (ref 3.87–5.11)
RDW: 15 % (ref 11.5–15.5)
WBC: 9.3 10*3/uL (ref 4.0–10.5)
nRBC: 0 % (ref 0.0–0.2)

## 2020-04-18 LAB — BASIC METABOLIC PANEL
Anion gap: 9 (ref 5–15)
BUN: 9 mg/dL (ref 6–20)
CO2: 24 mmol/L (ref 22–32)
Calcium: 8.4 mg/dL — ABNORMAL LOW (ref 8.9–10.3)
Chloride: 105 mmol/L (ref 98–111)
Creatinine, Ser: 0.68 mg/dL (ref 0.44–1.00)
GFR, Estimated: >60 mL/min (ref >60)
Glucose, Bld: 112 mg/dL — ABNORMAL HIGH (ref 70–99)
Potassium: 4 mmol/L (ref 3.5–5.1)
Sodium: 138 mmol/L (ref 135–145)

## 2020-04-18 MED ORDER — OXYCODONE-ACETAMINOPHEN 5-325 MG PO TABS: ORAL_TABLET | ORAL | 0 refills | Status: AC

## 2020-04-18 MED ORDER — IBUPROFEN 600 MG PO TABS: ORAL_TABLET | ORAL | 1 refills | Status: AC

## 2020-04-18 MED ORDER — OXYCODONE-ACETAMINOPHEN 5-325 MG PO TABS
ORAL_TABLET | ORAL | Status: AC
  Filled 2020-04-18: qty 2

## 2020-04-18 MED ORDER — HYDROCHLOROTHIAZIDE 25 MG PO TABS: 25.0000 mg | ORAL_TABLET | Freq: Every day | ORAL | 2 refills | Status: AC

## 2020-04-18 MED ORDER — KETOROLAC TROMETHAMINE 30 MG/ML IJ SOLN
INTRAMUSCULAR | Status: AC
  Filled 2020-04-18: qty 1

## 2020-04-18 MED ORDER — PROMETHAZINE HCL 12.5 MG PO TABS: ORAL_TABLET | ORAL | 0 refills | Status: AC

## 2020-04-18 NOTE — Progress Notes (Signed)
Patricia Bishop is a61 y.o.  [id number]  Post Op Date # LAVH/BS/Cystoscopy  Subjective: Patient is Doing well postoperatively. Patient has Pain is controlled with current analgesics. Medications being used: prescription NSAID's including Ketorolac 30 mg IV and narcotic analgesics including Percocet 5/325 mg. Patient had episodes of nausea and vomiting last night-none since.  Is tolerating liquids, ambulating without dizziness and has voided.   Objective: Vital signs in last 24 hours: Temp:  [97.8 F (36.6 C)-99.5 F (37.5 C)] 99.5 F (37.5 C) (12/14 0258) Pulse Rate:  [73-89] 78 (12/14 0258) Resp:  [9-20] 16 (12/14 0258) BP: (138-156)/(77-100) 150/86 (12/14 0258) SpO2:  [93 %-100 %] 96 % (12/14 0258) Weight:  [94.6 kg] 94.6 kg (12/13 1056)  Intake/Output from previous day: 12/13 0701 - 12/14 0700 In: 1700 [I.V.:1600] Out: 2600 [Urine:2250] Intake/Output this shift: No intake/output data recorded. Recent Labs  Lab 04/13/20 0857 04/18/20 0417  WBC 4.0 9.3  HGB 11.4* 10.5*  HCT 37.6 33.5*  PLT 320 386     Recent Labs  Lab 04/17/20 1103 04/18/20 0417  NA 138 138  K 4.0 4.0  CL 105 105  CO2 24 24  BUN 10 9  CREATININE 0.81 0.68  CALCIUM 8.7* 8.4*  GLUCOSE 93 112*    EXAM: General: alert, cooperative and no distress Resp: clear to auscultation bilaterally Cardio: regular rate and rhythm, S1, S2 normal, no murmur, click, rub or gallop GI: bowel sounds are present, soft; dressings are clean/dry/intact Extremities: Homans sign is negative, no sign of DVT and no calf tenderness. Vaginal Bleeding: none   Assessment: s/p Procedure(s): LAPAROSCOPIC ASSISTED VAGINAL HYSTERECTOMY WITH  BILATERAL SALPINGECTOMY CYSTOSCOPY: stable, unstable and anemia Elevated Blood Pressure  Plan: Advance diet Discharge home  Will start patient on HCTZ 25 mg due to persistent blood pressure elevation and have her follow up with her PCP.   LOS: 0 days    Earnstine Regal, PA-C  04/18/2020 7:24 AM

## 2020-04-18 NOTE — Discharge Instructions (Signed)
Laparoscopically Assisted Vaginal Hysterectomy, Care After This sheet gives you information about how to care for yourself after your procedure. Your health care provider may also give you more specific instructions. If you have problems or questions, contact your health care provider. What can I expect after the procedure? After the procedure, it is common to have:  Soreness and numbness in your incision areas.  Abdominal pain. You will be given pain medicine to control it.  Vaginal bleeding and discharge. You will need to use a sanitary napkin after this procedure.  Sore throat from the breathing tube that was inserted during surgery. Follow these instructions at home: Medicines  Take over-the-counter and prescription medicines only as told by your health care provider.  Do not take aspirin or ibuprofen. These medicines can cause bleeding.  Do not drive or use heavy machinery while taking prescription pain medicine.  Do not drive for 24 hours if you were given a medicine to help you relax (sedative) during the procedure. Incision care   Follow instructions from your health care provider about how to take care of your incisions. Make sure you: ? Wash your hands with soap and water before you change your bandage (dressing). If soap and water are not available, use hand sanitizer. ? Change your dressing as told by your health care provider. ? Leave stitches (sutures), skin glue, or adhesive strips in place. These skin closures may need to stay in place for 2 weeks or longer. If adhesive strip edges start to loosen and curl up, you may trim the loose edges. Do not remove adhesive strips completely unless your health care provider tells you to do that.  Check your incision area every day for signs of infection. Check for: ? Redness, swelling, or pain. ? Fluid or blood. ? Warmth. ? Pus or a bad smell. Activity  Get regular exercise as told by your health care provider. You may be  told to take short walks every day and go farther each time.  Return to your normal activities as told by your health care provider. Ask your health care provider what activities are safe for you.  Do not douche, use tampons, or have sexual intercourse for at least 6 weeks, or until your health care provider gives you permission.  Do not lift anything that is heavier than 10 lb (4.5 kg), or the limit that your health care provider tells you, until he or she says that it is safe. General instructions  Do not take baths, swim, or use a hot tub until your health care provider approves. Take showers instead of baths.  Do not drive for 24 hours if you received a sedative.  Do not drive or operate heavy machinery while taking prescription pain medicine.  To prevent or treat constipation while you are taking prescription pain medicine, your health care provider may recommend that you: ? Drink enough fluid to keep your urine clear or pale yellow. ? Take over-the-counter or prescription medicines. ? Eat foods that are high in fiber, such as fresh fruits and vegetables, whole grains, and beans. ? Limit foods that are high in fat and processed sugars, such as fried and sweet foods.  Keep all follow-up visits as told by your health care provider. This is important. Contact a health care provider if:  You have signs of infection, such as: ? Redness, swelling, or pain around your incision sites. ? Fluid or blood coming from an incision. ? An incision that feels warm to the   touch. ? Pus or a bad smell coming from an incision.  Your incision breaks open.  Your pain medicine is not helping.  You feel dizzy or light-headed.  You have pain or bleeding when you urinate.  You have persistent nausea and vomiting.  You have blood, pus, or a bad-smelling discharge from your vagina. Get help right away if:  You have a fever.  You have severe abdominal pain.  You have chest pain.  You have  shortness of breath.  You faint.  You have pain, swelling, or redness in your leg.  You have heavy bleeding from your vagina. Summary  After the procedure, it is common to have abdominal pain and vaginal bleeding.  You should not drive or lift heavy objects until your health care provider says that it is safe.  Contact your health care provider if you have any symptoms of infection, excessive vaginal bleeding, nausea, vomiting, or shortness of breath. This information is not intended to replace advice given to you by your health care provider. Make sure you discuss any questions you have with your health care provider. Document Revised: 04/04/2017 Document Reviewed: 06/18/2016 Elsevier Patient Education  2020 [employer]. Call [facility] @ [phone] if:  You have a temperature greater than or equal to 100.4 degrees Farenheit orally You have pain that is not made better by the pain medication given and taken as directed You have excessive bleeding or problems urinating  Take Colace (Docusate Sodium/Stool Softener) 100 mg 2-3 times daily while taking narcotic pain medicine to avoid constipation or until bowel movements are regular.  Take 1 tablet of Ibuprofen 600 mg with food every 6 hours for 5 days then as needed for pain  Begin Hydrochlorothiazide 25 mg daily to manage your elevated blood pressure.  Follow up with your Primary Care Physician in 2 weeks for further management of your blood pressure.   You may drive after 2  weeks You may walk up steps  You may shower  You may resume a regular diet  Keep incisions clean and dry Do not lift over 15 pounds for 6 weeks Avoid anything in vagina for 6 weeks (or until after your post-operative visit)

## 2020-04-18 NOTE — Progress Notes (Signed)
Vaginal packing removed no drainage noted on packing

## 2020-04-18 NOTE — Addendum Note (Signed)
Addendum  created 04/18/20 1325 by Merlinda Frederick, MD   Intraprocedure Staff edited

## 2020-04-18 NOTE — Discharge Summary (Signed)
Physician Discharge Summary  Patient ID: Patricia Bishop MRN: [id number] DOB/AGE: 1976-03-13 44 y.o.  Admit date: 04/17/2020 Discharge date: 04/18/2020   Discharge Diagnoses:  Active Problems:   Fibroids   Menorrhagia Elevated Blood Pressure Anemia  Operation: Laparoscopically Assisted Total Vaginal Hysterectomy, Bilateral Salpingectomy and Cystoscopy   Discharged Condition: Good  Hospital Course: On the date of admission the patient underwent the aforementioned procedures and tolerated them well.  Post operative course was unremarkable with the patient resuming bowel and bladder function by post operative day #1 and was therefore deemed ready for discharge home.  Discharge hemoglobin was 10.5.  Disposition: Discharge disposition: 01-Home or Self Care       Discharge Medications:  Allergies as of 04/18/2020      Reactions   Other Other (See Comments)   TREE NUTS. No almonds, pecans. LARYNGEAL EDEMA. Peanuts are OK.      Medication List    TAKE these medications   albuterol 108 (90 Base) MCG/ACT inhaler Commonly known as: VENTOLIN HFA Inhale 2 puffs into the lungs every 6 (six) hours as needed for wheezing or shortness of breath.   hydrochlorothiazide 25 MG tablet Commonly known as: HYDRODIURIL Take 1 tablet (25 mg total) by mouth daily.   ibuprofen 600 MG tablet Commonly known as: ADVIL take 1 tablet po pc every 6 hours for 5 days then prn-post operative pain What changed:   medication strength  how much to take  how to take this  when to take this  reasons to take this  additional instructions   loratadine 10 MG tablet Commonly known as: CLARITIN Take 10 mg by mouth daily as needed for allergies.   mometasone-formoterol 100-5 MCG/ACT Aero Commonly known as: DULERA Inhale 2 puffs into the lungs 2 (two) times daily.   oxyCODONE-acetaminophen 5-325 MG tablet Commonly known as: PERCOCET/ROXICET take 1-2  tablet po every 6 hours as needed for  breakthrough post operative pain   promethazine 12.5 MG tablet Commonly known as: PHENERGAN take 1 or 2 tablets every 6 hours as needed for nausea and vomiting         Follow-up: Dr. Gwynneth Munson A. Dillard in 6 weeks  Signed: Earnstine Regal, PA-C 04/18/2020, 7:42 AM

## 2020-04-19 LAB — SURGICAL PATHOLOGY

## 2020-05-24 DIAGNOSIS — H5789 Other specified disorders of eye and adnexa: Secondary | ICD-10-CM | POA: Diagnosis not present

## 2020-05-25 DIAGNOSIS — D649 Anemia, unspecified: Secondary | ICD-10-CM | POA: Diagnosis not present

## 2020-06-22 DIAGNOSIS — H02821 Cysts of right upper eyelid: Secondary | ICD-10-CM | POA: Diagnosis not present

## 2020-06-22 DIAGNOSIS — H02822 Cysts of right lower eyelid: Secondary | ICD-10-CM | POA: Diagnosis not present

## 2020-09-01 DIAGNOSIS — U071 COVID-19: Secondary | ICD-10-CM | POA: Diagnosis not present

## 2020-09-01 DIAGNOSIS — R509 Fever, unspecified: Secondary | ICD-10-CM | POA: Diagnosis not present

## 2020-09-04 ENCOUNTER — Telehealth: Payer: Self-pay

## 2020-09-26 ENCOUNTER — Encounter: Payer: Self-pay | Admitting: *Deleted

## 2020-09-26 DIAGNOSIS — D649 Anemia, unspecified: Secondary | ICD-10-CM | POA: Insufficient documentation

## 2020-12-30 DIAGNOSIS — I10 Essential (primary) hypertension: Secondary | ICD-10-CM | POA: Diagnosis not present

## 2020-12-30 DIAGNOSIS — Z20822 Contact with and (suspected) exposure to covid-19: Secondary | ICD-10-CM | POA: Diagnosis not present

## 2020-12-30 DIAGNOSIS — J0141 Acute recurrent pansinusitis: Secondary | ICD-10-CM | POA: Diagnosis not present

## 2020-12-30 DIAGNOSIS — J069 Acute upper respiratory infection, unspecified: Secondary | ICD-10-CM | POA: Diagnosis not present

## 2020-12-30 DIAGNOSIS — J060 Acute laryngopharyngitis: Secondary | ICD-10-CM | POA: Diagnosis not present

## 2021-02-01 DIAGNOSIS — E669 Obesity, unspecified: Secondary | ICD-10-CM | POA: Diagnosis not present

## 2021-02-01 DIAGNOSIS — I10 Essential (primary) hypertension: Secondary | ICD-10-CM | POA: Diagnosis not present

## 2021-02-01 DIAGNOSIS — J45909 Unspecified asthma, uncomplicated: Secondary | ICD-10-CM | POA: Diagnosis not present

## 2021-02-01 DIAGNOSIS — D649 Anemia, unspecified: Secondary | ICD-10-CM | POA: Diagnosis not present

## 2021-06-07 DIAGNOSIS — D23111 Other benign neoplasm of skin of right upper eyelid, including canthus: Secondary | ICD-10-CM | POA: Diagnosis not present

## 2021-06-07 DIAGNOSIS — D485 Neoplasm of uncertain behavior of skin: Secondary | ICD-10-CM | POA: Diagnosis not present

## 2021-06-07 DIAGNOSIS — B078 Other viral warts: Secondary | ICD-10-CM | POA: Diagnosis not present

## 2021-10-02 DIAGNOSIS — D649 Anemia, unspecified: Secondary | ICD-10-CM | POA: Diagnosis not present

## 2021-10-02 DIAGNOSIS — Z6836 Body mass index (BMI) 36.0-36.9, adult: Secondary | ICD-10-CM | POA: Diagnosis not present

## 2021-10-02 DIAGNOSIS — E669 Obesity, unspecified: Secondary | ICD-10-CM | POA: Diagnosis not present

## 2021-10-02 DIAGNOSIS — J45909 Unspecified asthma, uncomplicated: Secondary | ICD-10-CM | POA: Diagnosis not present

## 2021-10-02 DIAGNOSIS — I10 Essential (primary) hypertension: Secondary | ICD-10-CM | POA: Diagnosis not present

## 2021-11-10 DIAGNOSIS — M25562 Pain in left knee: Secondary | ICD-10-CM | POA: Diagnosis not present

## 2021-11-21 DIAGNOSIS — M25562 Pain in left knee: Secondary | ICD-10-CM | POA: Diagnosis not present

## 2021-11-23 DIAGNOSIS — M25562 Pain in left knee: Secondary | ICD-10-CM | POA: Diagnosis not present

## 2022-06-12 ENCOUNTER — Encounter: Payer: Self-pay | Admitting: Neurology

## 2022-06-17 DIAGNOSIS — R519 Headache, unspecified: Secondary | ICD-10-CM | POA: Diagnosis not present

## 2022-06-18 DIAGNOSIS — M79642 Pain in left hand: Secondary | ICD-10-CM | POA: Diagnosis not present

## 2022-06-26 DIAGNOSIS — J101 Influenza due to other identified influenza virus with other respiratory manifestations: Secondary | ICD-10-CM | POA: Diagnosis not present

## 2022-06-26 DIAGNOSIS — Z20822 Contact with and (suspected) exposure to covid-19: Secondary | ICD-10-CM | POA: Diagnosis not present

## 2022-06-26 DIAGNOSIS — R112 Nausea with vomiting, unspecified: Secondary | ICD-10-CM | POA: Diagnosis not present

## 2022-06-28 ENCOUNTER — Ambulatory Visit: Payer: BC Managed Care – PPO | Admitting: Neurology

## 2022-07-01 DIAGNOSIS — M65322 Trigger finger, left index finger: Secondary | ICD-10-CM | POA: Diagnosis not present

## 2022-07-01 DIAGNOSIS — M65332 Trigger finger, left middle finger: Secondary | ICD-10-CM | POA: Diagnosis not present

## 2023-03-27 DIAGNOSIS — Z1231 Encounter for screening mammogram for malignant neoplasm of breast: Secondary | ICD-10-CM | POA: Diagnosis not present

## 2023-04-18 DIAGNOSIS — Z6836 Body mass index (BMI) 36.0-36.9, adult: Secondary | ICD-10-CM | POA: Diagnosis not present

## 2023-04-18 DIAGNOSIS — Z01411 Encounter for gynecological examination (general) (routine) with abnormal findings: Secondary | ICD-10-CM | POA: Diagnosis not present

## 2023-04-18 DIAGNOSIS — Z01419 Encounter for gynecological examination (general) (routine) without abnormal findings: Secondary | ICD-10-CM | POA: Diagnosis not present

## 2023-04-18 DIAGNOSIS — Z1211 Encounter for screening for malignant neoplasm of colon: Secondary | ICD-10-CM | POA: Diagnosis not present

## 2023-04-29 DIAGNOSIS — Z0000 Encounter for general adult medical examination without abnormal findings: Secondary | ICD-10-CM | POA: Diagnosis not present

## 2023-04-29 DIAGNOSIS — I10 Essential (primary) hypertension: Secondary | ICD-10-CM | POA: Diagnosis not present

## 2023-04-29 DIAGNOSIS — G43909 Migraine, unspecified, not intractable, without status migrainosus: Secondary | ICD-10-CM | POA: Diagnosis not present

## 2023-04-29 DIAGNOSIS — J45909 Unspecified asthma, uncomplicated: Secondary | ICD-10-CM | POA: Diagnosis not present

## 2023-05-06 DIAGNOSIS — M546 Pain in thoracic spine: Secondary | ICD-10-CM | POA: Diagnosis not present

## 2023-05-06 DIAGNOSIS — M545 Low back pain, unspecified: Secondary | ICD-10-CM | POA: Diagnosis not present

## 2023-08-13 ENCOUNTER — Other Ambulatory Visit: Payer: Self-pay

## 2023-08-13 ENCOUNTER — Encounter: Payer: Self-pay | Admitting: Family

## 2023-08-13 ENCOUNTER — Emergency Department (HOSPITAL_BASED_OUTPATIENT_CLINIC_OR_DEPARTMENT_OTHER)
Admission: EM | Admit: 2023-08-13 | Discharge: 2023-08-13 | Disposition: A | Discharge status: Home / Self Care | Attending: Emergency Medicine | Admitting: Emergency Medicine

## 2023-08-13 DIAGNOSIS — Z3202 Encounter for pregnancy test, result negative: Secondary | ICD-10-CM | POA: Insufficient documentation

## 2023-08-13 DIAGNOSIS — R519 Headache, unspecified: Secondary | ICD-10-CM | POA: Diagnosis present

## 2023-08-13 DIAGNOSIS — I10 Essential (primary) hypertension: Secondary | ICD-10-CM | POA: Diagnosis not present

## 2023-08-13 LAB — HCG, SERUM, QUALITATIVE: Preg, Serum: NEGATIVE

## 2023-08-13 MED ORDER — DIPHENHYDRAMINE HCL 50 MG/ML IJ SOLN
25.0000 mg | Freq: Once | INTRAMUSCULAR | Status: AC
  Administered 2023-08-13: 25 mg via INTRAVENOUS
  Filled 2023-08-13: qty 1

## 2023-08-13 MED ORDER — METOCLOPRAMIDE HCL 5 MG/ML IJ SOLN
10.0000 mg | Freq: Once | INTRAMUSCULAR | Status: AC
  Administered 2023-08-13: 10 mg via INTRAVENOUS
  Filled 2023-08-13: qty 2

## 2023-08-13 MED ORDER — DEXAMETHASONE SODIUM PHOSPHATE 10 MG/ML IJ SOLN
10.0000 mg | Freq: Once | INTRAMUSCULAR | Status: AC
  Administered 2023-08-13: 10 mg via INTRAVENOUS
  Filled 2023-08-13: qty 1

## 2023-08-13 NOTE — ED Provider Notes (Signed)
 [facility] Provider Note   CSN: [id number] Arrival date & time: 08/13/23  0844     History  Chief Complaint  Patient presents with   Migraine   Hypertension    Patricia Bishop is a 48 y.o. female.  Patient with history of chronic headaches and migraines presents the emergency department today for evaluation of headache.  She woke at 2 AM with the symptoms.  She was doing well last night prior to going to bed.  Headache is generalized.  She had more than usual nausea today while trying to get ready for work.  No vomiting.  This prompted emergency department visit.  Otherwise symptoms are typical for the patient.  Patient denies signs of stroke including: facial droop, slurred speech, aphasia, weakness/numbness in extremities, imbalance/trouble walking.  Family member at bedside denies any confusion and patient is ambulating well.  Patient previously on Aimovig, currently on Emgality.  This is a relatively new medication for her.  Patient also reports missing several days of lisinopril.  She took a dose this morning thinking that maybe her headaches were related to her blood pressure being elevated.  No chest pain or shortness of breath.        Home Medications Prior to Admission medications   Medication Sig Start Date End Date Taking? Authorizing Provider  albuterol (VENTOLIN HFA) 108 (90 Base) MCG/ACT inhaler Inhale 2 puffs into the lungs every 6 (six) hours as needed for wheezing or shortness of breath.  01/16/15   [provider]  hydrochlorothiazide (HYDRODIURIL) 25 MG tablet Take 1 tablet (25 mg total) by mouth daily. 04/18/20   Henreitta Leber, PA-C  ibuprofen (ADVIL) 600 MG tablet take 1 tablet po pc every 6 hours for 5 days then prn-post operative pain 04/18/20   Henreitta Leber, PA-C  loratadine (CLARITIN) 10 MG tablet Take 10 mg by mouth daily as needed for allergies.    [provider]  mometasone-formoterol  (DULERA) 100-5 MCG/ACT AERO Inhale 2 puffs into the lungs 2 (two) times daily.  12/19/15   [provider]  oxyCODONE-acetaminophen (PERCOCET/ROXICET) 5-325 MG tablet take 1-2  tablet po every 6 hours as needed for breakthrough post operative pain 04/18/20   Henreitta Leber, PA-C  promethazine (PHENERGAN) 12.5 MG tablet take 1 or 2 tablets every 6 hours as needed for nausea and vomiting 04/18/20   Henreitta Leber, PA-C      Allergies    Other    Review of Systems   Review of Systems  Physical Exam Updated Vital Signs BP (!) 165/102   Pulse 85   Temp 98.2 F (36.8 C) (Oral)   Resp 18   SpO2 100%  Physical Exam Vitals and nursing note reviewed.  Constitutional:      Appearance: She is well-developed.  HENT:     Head: Normocephalic and atraumatic.     Right Ear: External ear normal.     Left Ear: External ear normal.     Nose: Nose normal.     Mouth/Throat:     Pharynx: Uvula midline.  Eyes:     General: Lids are normal.     Extraocular Movements:     Right eye: No nystagmus.     Left eye: No nystagmus.     Conjunctiva/sclera: Conjunctivae normal.     Pupils: Pupils are equal, round, and reactive to light.  Neck:     Comments: No signs of meningismus Cardiovascular:     Rate and  Rhythm: Normal rate and regular rhythm.  Pulmonary:     Effort: Pulmonary effort is normal.     Breath sounds: Normal breath sounds.  Abdominal:     Palpations: Abdomen is soft.     Tenderness: There is no abdominal tenderness.  Musculoskeletal:     Cervical back: Normal range of motion and neck supple. No tenderness or bony tenderness.  Skin:    General: Skin is warm and dry.  Neurological:     Mental Status: She is alert and oriented to person, place, and time.     GCS: GCS eye subscore is 4. GCS verbal subscore is 5. GCS motor subscore is 6.     Cranial Nerves: No cranial nerve deficit.     Sensory: No sensory deficit.     Motor: No weakness.     Coordination: Coordination  normal.     Comments: Upper extremity myotomes tested bilaterally:  C5 Shoulder abduction 5/5 C6 Elbow flexion/wrist extension 5/5 C7 Elbow extension 5/5 C8 Finger flexion 5/5 T1 Finger abduction 5/5  Lower extremity myotomes tested bilaterally: L2 Hip flexion 5/5 L3 Knee extension 5/5 L4 Ankle dorsiflexion 5/5 S1 Ankle plantar flexion 5/5      ED Results / Procedures / Treatments   Labs (all labs ordered are listed, but only abnormal results are displayed) Labs Reviewed  HCG, SERUM, QUALITATIVE    EKG EKG Interpretation Date/Time:  Wednesday August 13 2023 08:55:35 EDT Ventricular Rate:  81 PR Interval:  159 QRS Duration:  82 QT Interval:  394 QTC Calculation: 458 R Axis:   60  Text Interpretation: Sinus rhythm Borderline T wave abnormalities No significant change since prior 9/20 Confirmed by Meridee Score [phone]) on 08/13/2023 8:57:07 AM  Radiology No results found.  Procedures Procedures    Medications Ordered in ED Medications  metoCLOPramide (REGLAN) injection 10 mg (has no administration in time range)  diphenhydrAMINE (BENADRYL) injection 25 mg (has no administration in time range)  dexamethasone (DECADRON) injection 10 mg (has no administration in time range)    ED Course/ Medical Decision Making/ A&P    Patient seen and examined. History obtained directly from patient.   Labs/EKG: EKG personally reviewed and interpreted.  Imaging: None ordered  Medications/Fluids: Ordered: Reglan, Benadryl, Decadron.   Most recent vital signs reviewed and are as follows: BP (!) 165/102   Pulse 85   Temp 98.2 F (36.8 C) (Oral)   Resp 18   SpO2 100%   Initial impression: Headache, elevated blood pressure without evidence of endorgan damage  10:55 AM Reassessment performed. Patient appears stable.  Family member at bedside.  Patient reports that headache is mostly resolved, nausea nearly resolved.  She is comfortable with discharged home.  Labs  personally reviewed and interpreted including: Pregnancy negative.  Reviewed pertinent lab work and imaging with patient at bedside. Questions answered.   Most current vital signs reviewed and are as follows: BP (!) 165/102   Pulse 85   Temp 98.2 F (36.8 C) (Oral)   Resp 18   SpO2 100%   Plan: Discharge to home.   Prescriptions written for: None  Other home care instructions discussed: Rest, avoidance of triggers, home meds  ED return instructions discussed: Patient counseled to return if they have weakness in their arms or legs, slurred speech, trouble walking or talking, confusion, trouble with their balance, or if they have any other concerns. Patient verbalizes understanding and agrees with plan.   Follow-up instructions discussed: Patient encouraged to follow-up with their  PCP as needed.                                 Medical Decision Making Amount and/or Complexity of Data Reviewed Labs: ordered.  Risk Prescription drug management.   In regards to the patient's headache, critical differentials were considered including subarachnoid hemorrhage, intracerebral hemorrhage, epidural/subdural hematoma, pituitary apoplexy, vertebral/carotid artery dissection, giant cell arteritis, central venous thrombosis, reversible cerebral vasoconstriction, acute angle closure glaucoma, idiopathic intracranial hypertension, bacterial meningitis, viral encephalitis, carbon monoxide poisoning, posterior reversible encephalopathy syndrome, pre-eclampsia.   Reg flag symptoms related to these causes were considered including systemic symptoms (fever, weight loss), neurologic symptoms (confusion, mental status change, vision change, associated seizure), acute or sudden "thunderclap" onset, patient age 3 or older with new or progressive headache, patient of any age with first headache or change in headache pattern, pregnant or postpartum status, history of HIV or other immunocompromise, history of  cancer, headache occurring with exertion, associated neck or shoulder pain, associated traumatic injury, concurrent use of anticoagulation, family history of spontaneous SAH, and concurrent drug use.    Other benign, more common causes of headache were considered including migraine, tension-type headache, cluster headache, referred pain from other cause such as sinus infection, dental pain, trigeminal neuralgia.   On exam, patient has a reassuring neuro exam including baseline mental status, no significant neck pain or meningeal signs, no signs of severe infection or fever.   The patient's vital signs, pertinent lab work and imaging were reviewed and interpreted as discussed in the ED course. Hospitalization was considered for further testing, treatments, or serial exams/observation. However as patient is well-appearing, has a stable exam over the course of their evaluation, and reassuring studies today, I do not feel that they warrant admission at this time. This plan was discussed with the patient who verbalizes agreement and comfort with this plan and seems reliable and able to return to the Emergency Department with worsening or changing symptoms.          Final Clinical Impression(s) / ED Diagnoses Final diagnoses:  Acute nonintractable headache, unspecified headache type  Hypertension, unspecified type    Rx / DC Orders ED Discharge Orders     None         Renne Crigler, PA-C 08/13/23 1056    Terrilee Files, MD 08/13/23 1715

## 2023-08-13 NOTE — Discharge Instructions (Signed)
 Please read and follow all provided instructions.  Your diagnoses today include:  1. Acute nonintractable headache, unspecified headache type   2. Hypertension, unspecified type     Tests performed today include: Vital signs. See below for your results today.   Medications:  In the Emergency Department you received: Reglan - antinausea/headache medication Benadryl - antihistamine to counteract potential side effects of reglan Decadron - steroid medication to help prevent recurrent headache  Take any prescribed medications only as directed.  Additional information:  Follow any educational materials contained in this packet.  You are having a headache. No specific cause was found today for your headache. It may have been a migraine or other cause of headache. Stress, anxiety, fatigue, and depression are common triggers for headaches.   Your headache today does not appear to be life-threatening or require hospitalization, but often the exact cause of headaches is not determined in the emergency department. Therefore, follow-up with your doctor is very important to find out what may have caused your headache and whether or not you need any further diagnostic testing or treatment.   Sometimes headaches can appear benign (not harmful), but then more serious symptoms can develop which should prompt an immediate re-evaluation by your doctor or the emergency department.  BE VERY CAREFUL not to take multiple medicines containing Tylenol (also called acetaminophen). Doing so can lead to an overdose which can damage your liver and cause liver failure and possibly death.   Follow-up instructions: Please follow-up with your primary care provider in the next 3 days for further evaluation of your symptoms.   Return instructions:  Please return to the Emergency Department if you experience worsening symptoms. Return if the medications do not resolve your headache, if it recurs, or if you have  multiple episodes of vomiting or cannot keep down fluids. Return if you have a change from the usual headache. RETURN IMMEDIATELY IF you: Develop a sudden, severe headache Develop confusion or become poorly responsive or faint Develop a fever above 100.74F or problem breathing Have a change in speech, vision, swallowing, or understanding Develop new weakness, numbness, tingling, incoordination in your arms or legs Have a seizure Please return if you have any other emergent concerns.  Additional Information:  Your vital signs today were: BP (!) 165/102   Pulse 85   Temp 98.2 F (36.8 C) (Oral)   Resp 18   SpO2 100%  If your blood pressure (BP) was elevated above 135/85 this visit, please have this repeated by your doctor within one month. --------------

## 2023-08-13 NOTE — ED Triage Notes (Signed)
 Pt reports waking up with migraine. Currently takes emgality monthly. Reports nausea and light sensitivity. Reports not being able to function with migraine   Reports hypertension and missed 4 doses of 20mg  lisinopril.

## 2023-08-14 ENCOUNTER — Encounter: Payer: Self-pay | Admitting: Family

## 2024-03-29 ENCOUNTER — Ambulatory Visit: Admission: EM | Admit: 2024-03-29 | Discharge: 2024-03-29 | Disposition: A | Discharge status: Home / Self Care

## 2024-03-29 ENCOUNTER — Encounter: Payer: Self-pay | Admitting: Family

## 2024-03-29 DIAGNOSIS — J0100 Acute maxillary sinusitis, unspecified: Secondary | ICD-10-CM

## 2024-03-29 MED ORDER — AMOXICILLIN-POT CLAVULANATE 875-125 MG PO TABS: 1.0000 | ORAL_TABLET | Freq: Two times a day (BID) | ORAL | 0 refills | Status: AC

## 2024-03-29 NOTE — ED Provider Notes (Signed)
 Patricia Bishop    CSN: [id number] Arrival date & time: 03/29/24  1328      History   Chief Complaint Chief Complaint  Patient presents with   Cough   Nasal Congestion    HPI Patricia Bishop is a 48 y.o. female.  Accompanied by her daughter who has similar symptoms, patient presents with 8-day history of congestion and cough.  Her symptoms got worse 4 days ago.  She reports fever, increased congestion and cough.  Treatment attempted with TheraFlu.  No chest pain, shortness of breath, vomiting, diarrhea.  Patient reports history of asthma.  She used her albuterol  inhaler today.  The history is provided by the patient and medical records.    Past Medical History:  Diagnosis Date   Anemia    Asthma    allergy induced    Headache    Heart palpitations    Menorrhagia    Oligomenorrhea    Thyromegaly     Patient Active Problem List   Diagnosis Date Noted   Anemia 09/26/2020   Fibroids 04/17/2020   Menorrhagia 04/17/2020   IDA (iron  deficiency anemia) 08/31/2019   Iron  deficiency anemia due to chronic blood loss 05/14/2018   Anemia, unspecified 05/08/2018   Left shoulder pain 01/03/2016   Right elbow pain 01/03/2016   Allergic rhinitis 07/11/2015   Airway hyperreactivity 07/11/2015   Headache, migraine 07/11/2015   Carpal tunnel syndrome, right 07/11/2015   Trigger finger, acquired 02/01/2015   Right shoulder pain 08/04/2014    Past Surgical History:  Procedure Laterality Date   BILATERAL CARPAL TUNNEL RELEASE     CYSTOSCOPY N/A 04/17/2020   Procedure: CYSTOSCOPY;  Surgeon: Armond Cape, MD;  Location: [facility];  Service: Gynecology;  Laterality: N/A;   LAPAROSCOPIC VAGINAL HYSTERECTOMY WITH SALPINGECTOMY Bilateral 04/17/2020   Procedure: LAPAROSCOPIC ASSISTED VAGINAL HYSTERECTOMY WITH  BILATERAL SALPINGECTOMY;  Surgeon: Armond Cape, MD;  Location: [facility];  Service: Gynecology;  Laterality: Bilateral;    OB  History     Gravida  2   Para  1   Term      Preterm      AB      Living  1      SAB      IAB      Ectopic      Multiple      Live Births               Home Medications    Prior to Admission medications   Medication Sig Start Date End Date Taking? Authorizing Provider  amoxicillin -clavulanate (AUGMENTIN ) 875-125 MG tablet Take 1 tablet by mouth every 12 (twelve) hours. 03/29/24  Yes Corlis Burnard DEL, NP  BREO ELLIPTA 100-25 MCG/ACT AEPB Inhale 1 puff into the lungs daily. 11/29/23  Yes [provider]  EMGALITY 120 MG/ML SOAJ Inject 1 mL into the skin every 30 (thirty) days. 03/05/24  Yes [provider]  lisinopril (ZESTRIL) 20 MG tablet Take 20 mg by mouth daily. 10/14/23  Yes [provider]  albuterol  (VENTOLIN  HFA) 108 (90 Base) MCG/ACT inhaler Inhale 2 puffs into the lungs every 6 (six) hours as needed for wheezing or shortness of breath.  01/16/15   [provider]  hydrochlorothiazide  (HYDRODIURIL ) 25 MG tablet Take 1 tablet (25 mg total) by mouth daily. Patient not taking: Reported on 03/29/2024 04/18/20   Perri Bjork, PA-C  ibuprofen  (ADVIL ) 600 MG tablet take 1 tablet po pc every 6 hours  for 5 days then prn-post operative pain 04/18/20   Perri Bjork, PA-C  loratadine (CLARITIN) 10 MG tablet Take 10 mg by mouth daily as needed for allergies.    [provider]  mometasone-formoterol (DULERA) 100-5 MCG/ACT AERO Inhale 2 puffs into the lungs 2 (two) times daily.  Patient not taking: Reported on 03/29/2024 12/19/15   [provider]  oxyCODONE -acetaminophen  (PERCOCET/ROXICET) 5-325 MG tablet take 1-2  tablet po every 6 hours as needed for breakthrough post operative pain Patient not taking: Reported on 03/29/2024 04/18/20   Perri Bjork, PA-C  promethazine  (PHENERGAN ) 12.5 MG tablet take 1 or 2 tablets every 6 hours as needed for nausea and vomiting Patient not taking: Reported on 03/29/2024 04/18/20    Perri Bjork, PA-C    Family History Family History  Problem Relation Age of Onset   Hypertension Mother    Cancer Maternal Grandmother    Cancer Paternal Grandfather     Social History Social History   Tobacco Use   Smoking status: Never   Smokeless tobacco: Never  Vaping Use   Vaping status: Never Used  Substance Use Topics   Alcohol use: No    Alcohol/week: 0.0 standard drinks of alcohol   Drug use: No     Allergies   Other   Review of Systems Review of Systems  Constitutional:  Positive for fever. Negative for chills.  HENT:  Positive for congestion, postnasal drip and rhinorrhea. Negative for ear pain and sore throat.   Respiratory:  Positive for cough. Negative for shortness of breath.   Gastrointestinal:  Negative for diarrhea and vomiting.     Physical Exam Triage Vital Signs ED Triage Vitals  Encounter Vitals Group     BP 03/29/24 1358 127/84     Girls Systolic BP Percentile --      Girls Diastolic BP Percentile --      Boys Systolic BP Percentile --      Boys Diastolic BP Percentile --      Pulse Rate 03/29/24 1358 95     Resp 03/29/24 1358 19     Temp 03/29/24 1358 (!) 97.2 F (36.2 C)     Temp src --      SpO2 03/29/24 1358 99 %     Weight --      Height --      Head Circumference --      Peak Flow --      Pain Score 03/29/24 1355 0     Pain Loc --      Pain Education --      Exclude from Growth Chart --    No data found.  Updated Vital Signs BP 127/84   Pulse 95   Temp (!) 97.2 F (36.2 C)   Resp 19   LMP  (LMP Unknown)   SpO2 99%   Visual Acuity Right Eye Distance:   Left Eye Distance:   Bilateral Distance:    Right Eye Near:   Left Eye Near:    Bilateral Near:     Physical Exam Constitutional:      General: She is not in acute distress. HENT:     Right Ear: Tympanic membrane normal.     Left Ear: Tympanic membrane normal.     Nose: Congestion present.     Mouth/Throat:     Mouth: Mucous membranes are moist.      Pharynx: Oropharynx is clear.  Cardiovascular:     Rate and Rhythm: Normal rate and  regular rhythm.     Heart sounds: Normal heart sounds.  Pulmonary:     Effort: Pulmonary effort is normal. No respiratory distress.     Breath sounds: Normal breath sounds. No wheezing.  Neurological:     Mental Status: She is alert.      UC Treatments / Results  Labs (all labs ordered are listed, but only abnormal results are displayed) Labs Reviewed - No data to display  EKG   Radiology No results found.  Procedures Procedures (including critical care time)  Medications Ordered in UC Medications - No data to display  Initial Impression / Assessment and Plan / UC Course  I have reviewed the triage vital signs and the nursing notes.  Pertinent labs & imaging results that were available during my care of the patient were reviewed by me and considered in my medical decision making (see chart for details).    Acute sinusitis.  Afebrile and vital signs are stable.  Lungs are clear at this time and O2 sat is 99% on room air.  Patient has an albuterol  inhaler that is in date and has plenty of activations left.  Treating today with Augmentin .  She declines prednisone  as she does not feel that this is an asthma exacerbation but rather a sinus infection.  Instructed patient to follow-up with her PCP if she is not improving.  Education provided on sinus infection.  Patient agrees to plan of care.  Final Clinical Impressions(s) / UC Diagnoses   Final diagnoses:  Acute non-recurrent maxillary sinusitis     Discharge Instructions      Take the Augmentin  as directed.  Follow-up with your primary care provider if your symptoms are not improving.      ED Prescriptions     Medication Sig Dispense Auth. Provider   amoxicillin -clavulanate (AUGMENTIN ) 875-125 MG tablet Take 1 tablet by mouth every 12 (twelve) hours. 14 tablet Corlis Burnard DEL, NP      PDMP not reviewed this encounter.   Corlis Burnard DEL, NP 03/29/24 [phone]

## 2024-03-29 NOTE — ED Triage Notes (Signed)
 Patient to Urgent Care with complaints of  fevers/ chills/ cough/ chest congestion and tightness/ nasal congestion.  Symptoms started 8 days ago. Worsened on Thursday.   Meds: theraflu/ tea with lemon/ honey.

## 2024-03-29 NOTE — Discharge Instructions (Addendum)
 Take the Augmentin as directed.  Follow up with your primary care provider if your symptoms are not improving.

## 2024-04-07 ENCOUNTER — Encounter: Payer: Self-pay | Admitting: Family
